# Patient Record
Sex: Female | Born: 1970
Health system: Southern US, Community
[De-identification: ages and names within clinical notes are randomized; demographics above are authoritative.]

## PROBLEM LIST (undated history)

## (undated) DIAGNOSIS — T4145XA Adverse effect of unspecified anesthetic, initial encounter: Secondary | ICD-10-CM

## (undated) DIAGNOSIS — T8859XA Other complications of anesthesia, initial encounter: Secondary | ICD-10-CM

## (undated) DIAGNOSIS — C801 Malignant (primary) neoplasm, unspecified: Secondary | ICD-10-CM

## (undated) HISTORY — PX: WISDOM TOOTH EXTRACTION: SHX21

## (undated) HISTORY — DX: Malignant (primary) neoplasm, unspecified: C80.1

## (undated) HISTORY — PX: COLONOSCOPY: SHX174

## (undated) HISTORY — PX: BREAST SURGERY: SHX581

## (undated) HISTORY — PX: TONSILLECTOMY: SUR1361

---

## 2005-04-22 ENCOUNTER — Ambulatory Visit (HOSPITAL_BASED_OUTPATIENT_CLINIC_OR_DEPARTMENT_OTHER): Admission: RE | Admit: 2005-04-22 | Discharge: 2005-04-22 | Payer: Self-pay | Admitting: Plastic Surgery

## 2005-04-22 ENCOUNTER — Encounter (INDEPENDENT_AMBULATORY_CARE_PROVIDER_SITE_OTHER): Payer: Self-pay | Admitting: Specialist

## 2006-10-10 ENCOUNTER — Encounter: Admission: RE | Admit: 2006-10-10 | Discharge: 2006-10-10 | Payer: Self-pay | Admitting: Gastroenterology

## 2008-03-20 ENCOUNTER — Ambulatory Visit: Payer: Self-pay | Admitting: Vascular Surgery

## 2008-07-26 ENCOUNTER — Ambulatory Visit: Payer: Self-pay | Admitting: Vascular Surgery

## 2008-09-04 ENCOUNTER — Ambulatory Visit: Payer: Self-pay | Admitting: Vascular Surgery

## 2008-09-18 ENCOUNTER — Ambulatory Visit: Payer: Self-pay | Admitting: Vascular Surgery

## 2010-11-10 NOTE — Assessment & Plan Note (Signed)
OFFICE VISIT   Gombert, Kelin  DOB:  15-Oct-1970                                       07/26/2008  EAVWU#:[id number]   The patient presents today for continued follow-up of her bilateral  venous pathology.  The patient is a very pleasant 38-year white female  with bilateral venous pathology.  She has marked reticular varicosities  over her right anterior thigh extending to her right lateral knee.  She  had undergone formal duplex 3 months ago and this showed no evidence of  reflux in her saphenofemoral system on the right.  On the left leg she  has tributary varicose veins in her medial thigh extending down onto her  calf.  Duplex on this side also showed no evidence of saphenous vein  reflux.  She has worn thigh-high graduated compression stockings since  our initial evaluation with her and she reports this does not give her  any relief whatsoever.  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  She has difficulty doing her usual  household activities due to leg pain as well.  Her physical exam is  unchanged.  She has palpable dorsalis pedis pulses bilaterally.  She  does have marked reticular veins over her right anterior thigh and  tributary varicose veins through her left medial thigh and calf.  I  again imaged her left saphenous vein with handheld duplex.  These  varicosities do arise off the saphenous vein in the calf, but again I  cannot demonstrate any evidence of reflux.  I discussed the options with  Ms. Cerrato.  I explained that her treatment option would be sclerotherapy  on the reticular veins on the right leg and would recommend stab  phlebectomy of the varicose veins on her left leg.  I explained that  since she is forming these at an early age that she is at significant  risk for continued deformities over time.  I explained that we would  attempt to  treat as many as possible but that she would have, obviously,  some residual reticular veins due to the diffuse nature of these.  Hopefully, we can improve her symptoms with sclerotherapy on the right  and phlebectomy on the left.  We will schedule this at her convenience.   Larina Earthly, M.D.  Electronically Signed   TFE/MEDQ  D:  07/26/2008  T:  07/29/2008  Job:  [id number]   cc:   Chales Salmon. Abigail Miyamoto, M.D.

## 2010-11-10 NOTE — Procedures (Signed)
LOWER EXTREMITY VENOUS REFLUX EXAM   INDICATION:  Bilateral lower extremity painful varicose veins.   EXAM:  Using color-flow imaging and pulse Doppler spectral analysis, the  right and left common femoral, superficial femoral, popliteal, posterior  tibial greater and lesser saphenous veins are evaluated.  There is  evidence suggesting deep venous insufficiency in the right and left  lower extremity.   The right and left saphenofemoral junctions are competent.  The right  and left GSV's are competent.   The right and left proximal short saphenous veins demonstrate  competency.   GSV Diameter (used if found to be incompetent only)                                            Right    Left  Proximal Greater Saphenous Vein           cm       cm  Proximal-to-mid-thigh                     cm       cm  Mid thigh                                 cm       cm  Mid-distal thigh                          cm       cm  Distal thigh                              cm       cm  Knee                                      cm       cm   IMPRESSION:  1. The right and left greater saphenous veins show no evidence of      significant reflux.  2. The right and left greater saphenous veins are not aneurysmal.  3. The right and left greater saphenous veins are not tortuous.  4. The deep venous system is mildly incompetent.  5. The right and left lesser saphenous veins are competent.   ___________________________________________  Larina Earthly, M.D.   AS/MEDQ  D:  03/20/2008  T:  03/20/2008  Job:  [phone]

## 2010-11-10 NOTE — Assessment & Plan Note (Signed)
OFFICE VISIT   [name], Carol  DOB:  Dec 03, 1970                                       09/18/2008  OVFIE#:[id number]   The patient presents today for followup after her treatment for venous  pathology on 09/04/2008.  She had treatment on her left leg with  multiple stabs in her thigh and calf.  She had sclerotherapy on her  right leg to some reticular varicosities.  She looked quite good today  with minimal discomfort.  There is one area on her anterior right thigh  of sclerotherapy where she had a very prominent blue vein in the skin.  This has thrombosed as expected with the sclerotherapy.  I did discuss  this with the patient and recommended a small stab with an 11 blade over  this to express any blood under the skin.  This was done under sterile  condition.  She did have some old blood expressed from this area.  She  is quite pleased with her initial result as am I.  We will see her again  on an as needed basis.   Larina Earthly, M.D.  Electronically Signed   TFE/MEDQ  D:  09/18/2008  T:  09/19/2008  Job:  [id number]   cc:   Chales Salmon. Abigail Miyamoto, M.D.

## 2010-11-10 NOTE — Consult Note (Signed)
NEW PATIENT CONSULTATION   Carol [name], Carol [name]  DOB:  02-Jul-1970                                       03/20/2008  [id number]#:[id number]   The patient presents today for evaluation of bilateral lower extremity  venous pathology.  She is a healthy 40 year old white female with  progressively severe reticular varicosities in both lower extremities.  These initially presented 14 years ago in her medial left thigh at the  time of pregnancy.  She has had progression of these and also has  reticular varicosity that begins on her right anterior thigh and extends  laterally to her lateral thigh, lateral knee, and right calf.  She does  not have any history of deep vein thrombosis or bleeding.  She reports  an aching, itchy, painful sensation over the varicosities bilaterally.   PAST HISTORY:  Significant only for sinusitis, for which she takes  Allegra and Flonase as needed.   ALLERGIES:  Sulfa and Keflex.   She is a nonsmoker.  She does have a question of restless leg syndrome.  She does not drink alcohol on a regular basis.   REVIEW OF SYSTEMS:  Her weight is reported 105 pounds, she is 5 feet 1  inches tall.  She has no cardiac, pulmonary, GI, or GU difficulties.   PHYSICAL EXAM:  She is a thin white female in no acute distress.  She  does have reticular varicosities that are bulging in her medial left  thigh and 1 area of reticular varicosities that are prominent and  bulging in her right anterior thigh.  She has subcutaneous reticular  varicosities extending down her right lateral thigh.  She does not have  any significant swelling.   She underwent noninvasive vascular studies in our office and this  reveals no evidence of significant reflux in her deep or superficial  system.  She does have reflux in the tributary varicosities in her left  medial thigh.  I discussed the significance with the patient.  I  explained that she is not at any increased risk for significant  venous  pathology, such as deep vein thrombosis, related to this.  We have  considered her for thigh-high graduated compression stockings today to  determine if this will improve her symptoms.  She is instructed on the  daily use of these.  I have also explained that these reticular  varicosities are of a size that could be treated with sclerotherapy in  our office.  We will initiate the conservative treatment with elevation,  ibuprofen, and graduated compression garments.  Plan to see her again in  3 months for a further discussion.   Larina Earthly, M.D.  Electronically Signed   TFE/MEDQ  D:  03/20/2008  T:  03/21/2008  Job:  [id number]   cc:   Chales Salmon. Abigail Miyamoto, M.D.

## 2010-11-10 NOTE — Assessment & Plan Note (Signed)
OFFICE VISIT   [name], Carol [name]  DOB:  Jul 24, 1970                                       09/04/2008  [id number]#:[id number]   The patient presents today for stab phlebectomy of painful reticular  varicosities bilaterally.  These were in her right thigh and calf.  She  also underwent sclerotherapy to both legs and her left proximal groin  and right lateral calf for painful reticular veins.  This was with 0.3%  sodium tetradecyl mixed 50% with CO2.  She had no immediate  complications and will see me again in 1 month for continued followup.   Larina Earthly, M.D.  Electronically Signed   TFE/MEDQ  D:  09/04/2008  T:  09/05/2008  Job:  [id number]

## 2010-11-13 NOTE — Op Note (Signed)
NAMEKRISTOL, Carol [name]                   ACCOUNT NO.:  [id number]   MEDICAL RECORD NO.:  [id number]          PATIENT TYPE:  AMB   LOCATION:  DSC                          FACILITY:  [facility]   PHYSICIAN:  Alfredia Ferguson, M.D.  DATE OF BIRTH:  Apr 08, 1971   DATE OF PROCEDURE:  04/22/2005  DATE OF DISCHARGE:                                 OPERATIVE REPORT   PREOPERATIVE DIAGNOSES:  1.  Biopsy-proven dysplastic nevus, right upper paraspinous area, 9-mm      biopsy site.  2.  Biopsy-proven dysplastic nevus, right mid-abdomen, 8-mm biopsy site.   POSTOPERATIVE DIAGNOSES:  1.  Biopsy-proven dysplastic nevus, right upper paraspinous area, 9-mm      biopsy site.  2.  Biopsy-proven dysplastic nevus, right mid-abdomen, 8-mm biopsy site.   OPERATION PERFORMED:  1.  Excision of biopsy site, right upper back paraspinal area.  2.  Excision of biopsy site, right mid-abdomen.   SURGEON:  Alfredia Ferguson, M.D.   ANESTHESIA:  2% Xylocaine  and 1:100,000 epinephrine.   INDICATION FOR SURGERY:  This is a 40 year old woman with biopsy-proven  dysplastic nevi x2.  She wishes to undergo excision of the lesions in order  to clear the margins.  The patient understands the risk of positive margins,  unsightly scarring, dehiscence of the wound and overall dissatisfaction with  the results.  In spite of these risks, the patient wished to proceed with  the surgery.   DESCRIPTION OF OPERATION:  The patient was placed in a prone position first  with skin marks placed around the biopsy site in a transversely oriented  direction with approximately 2- to 3-mm margins.  Local anesthesia was  infiltrated.  The patient was then rolled into the supine position and skin  marks were placed around the abdominal biopsy site with approximately 2- to  3-mm margins.  Again, local anesthesia was infiltrated.  The abdominal  biopsy site was now prepped with Betadine and draped with sterile drapes.  After waiting 5 minutes, an  elliptical excision of the site down to the  level of the subcutaneous tissue was carried out.  Specimen was submitted  for pathology.  Hemostasis was accomplished using pressure.  The wound edges  were undermined for a distance of several millimeters in all directions.  The wound was closed by approximating the dermis with interrupted 4-0  Monocryl suture.  The skin edges were united with interrupted nylon sutures.  The area was cleansed and dried and a light  dressings was applied.  The patient was then rolled back into a prone  position, where an identical procedure was performed on the lesion in her  upper right back.  Closure was carried out in a similar fashion.  There area  was cleansed and dried and light dressings were applied.  The patient was  discharged home in satisfactory condition.      Alfredia Ferguson, M.D.  Electronically Signed     WBB/MEDQ  D:  04/22/2005  T:  04/22/2005  Job:  [id number]   cc:   Elmon Else MD

## 2012-01-25 DIAGNOSIS — L821 Other seborrheic keratosis: Secondary | ICD-10-CM | POA: Diagnosis present

## 2014-02-11 ENCOUNTER — Encounter (INDEPENDENT_AMBULATORY_CARE_PROVIDER_SITE_OTHER): Payer: Self-pay | Admitting: Surgery

## 2014-02-11 ENCOUNTER — Ambulatory Visit (INDEPENDENT_AMBULATORY_CARE_PROVIDER_SITE_OTHER): Payer: BC Managed Care – PPO | Admitting: Surgery

## 2014-02-11 VITALS — BP 98/60 | HR 64 | Temp 98.6°F | Resp 14 | Ht 61.0 in | Wt 104.6 lb

## 2014-02-11 DIAGNOSIS — K645 Perianal venous thrombosis: Secondary | ICD-10-CM

## 2014-02-11 NOTE — Progress Notes (Signed)
[place], MD,  [address]  [place], [place]    [place] Phone:  [phone] FAX:  [phone]   Re:   Carol [name] DOB:   [birth year] MRN:   [id number]  Urgent Office  ASSESSMENT AND PLAN: 1.  Thrombosed hemorrhoid   I did a I&D of the thrombosed hemorrhoid.  I gave her a book on hemorrhoids  Return appt is PRN.  HISTORY OF PRESENT ILLNESS: Chief Complaint  Patient presents with  . Follow-up    possible throm hems   Carol [name] is a 43 y.o. (DOB: 07-23-1970)  white  female who is a patient of No primary provider on file. and comes to Urgent Office today for thrombosed hemorrhoid. She comes by herself. She had a thrombosed hemorrhoid lanced by Dr. Madison Hickman about 3 to 4 years ago.   Her birth mother died of colon cancer in her 8's, so she gets colonoscopies every 5 years.  Her last colonoscopy was by Dr. Wynetta Emery in 2011, so she if up for next year.  She has trouble with hemorrhoids, but only rarely has them thrombose. [place] was [place] the beach [place], [place]13, when this began.  I think that she said she was helping her daughter move into college.  She has used Prep H and Tucks without relief.  She has done sitz baths, but only one per day.  Past Medical History  Diagnosis Date  . 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  PHYSICAL EXAM: BP 98/60  Pulse 64  Temp(Src) 98.6 F (37 C) (Oral)  Resp 14  Ht 5\' 1"  (1.549 m)  Wt 104 lb 9.6 oz (47.446 kg)  BMI 19.77 kg/m2  Rectum:  1.5 cm thrombosed hemorrhoid at the right lateral anal verge with necrosis.  It was already starting to decompress.  No rectal mass.  Procedure:  I cleaned her anus with betadine.  I infiltrated 3 cc of 1% xylocaine.  I made an incision in the skin and extracted two clots about 1.5 cm each.   DATA REVIEWED: Epic notes  Alphonsa Overall, MD,  [facility],  [address]  [place], [place]    [place] Phone:  [phone] FAX:  [phone]

## 2015-03-13 ENCOUNTER — Other Ambulatory Visit: Payer: Self-pay | Admitting: Gastroenterology

## 2015-03-14 ENCOUNTER — Encounter (HOSPITAL_COMMUNITY): Payer: Self-pay | Admitting: *Deleted

## 2015-03-24 ENCOUNTER — Ambulatory Visit (HOSPITAL_COMMUNITY)
Admission: RE | Admit: 2015-03-24 | Discharge: 2015-03-24 | Disposition: A | Discharge status: Home / Self Care | Payer: BLUE CROSS/BLUE SHIELD | Source: Ambulatory Visit | Attending: Gastroenterology | Admitting: Gastroenterology

## 2015-03-24 ENCOUNTER — Ambulatory Visit (HOSPITAL_COMMUNITY): Payer: BLUE CROSS/BLUE SHIELD | Admitting: Anesthesiology

## 2015-03-24 ENCOUNTER — Encounter (HOSPITAL_COMMUNITY)
Admission: RE | Disposition: A | Discharge status: Home / Self Care | Payer: Self-pay | Source: Ambulatory Visit | Attending: Gastroenterology

## 2015-03-24 ENCOUNTER — Encounter (HOSPITAL_COMMUNITY): Payer: Self-pay

## 2015-03-24 DIAGNOSIS — Z85820 Personal history of malignant melanoma of skin: Secondary | ICD-10-CM | POA: Insufficient documentation

## 2015-03-24 DIAGNOSIS — Z86010 Personal history of colonic polyps: Secondary | ICD-10-CM | POA: Diagnosis not present

## 2015-03-24 DIAGNOSIS — Z85828 Personal history of other malignant neoplasm of skin: Secondary | ICD-10-CM | POA: Insufficient documentation

## 2015-03-24 DIAGNOSIS — D125 Benign neoplasm of sigmoid colon: Secondary | ICD-10-CM | POA: Insufficient documentation

## 2015-03-24 DIAGNOSIS — Z1211 Encounter for screening for malignant neoplasm of colon: Secondary | ICD-10-CM | POA: Diagnosis not present

## 2015-03-24 HISTORY — DX: Other complications of anesthesia, initial encounter: T88.59XA

## 2015-03-24 HISTORY — PX: COLONOSCOPY WITH PROPOFOL: SHX5780

## 2015-03-24 HISTORY — DX: Adverse effect of unspecified anesthetic, initial encounter: T41.45XA

## 2015-03-24 SURGERY — COLONOSCOPY WITH PROPOFOL
Anesthesia: Monitor Anesthesia Care

## 2015-03-24 MED ORDER — PROPOFOL 10 MG/ML IV BOLUS
INTRAVENOUS | Status: DC | PRN
  Administered 2015-03-24 (×2): 30 mg via INTRAVENOUS
  Administered 2015-03-24 (×7): 20 mg via INTRAVENOUS
  Administered 2015-03-24: 40 mg via INTRAVENOUS
  Administered 2015-03-24 (×2): 20 mg via INTRAVENOUS
  Administered 2015-03-24: 10 mg via INTRAVENOUS
  Administered 2015-03-24 (×2): 20 mg via INTRAVENOUS

## 2015-03-24 MED ORDER — PROPOFOL 10 MG/ML IV BOLUS
INTRAVENOUS | Status: AC
  Filled 2015-03-24: qty 20

## 2015-03-24 MED ORDER — SODIUM CHLORIDE 0.9 % IV SOLN: INTRAVENOUS | Status: DC

## 2015-03-24 MED ORDER — PROMETHAZINE HCL 25 MG/ML IJ SOLN: 6.2500 mg | INTRAMUSCULAR | Status: DC | PRN

## 2015-03-24 MED ORDER — LACTATED RINGERS IV SOLN
INTRAVENOUS | Status: DC
  Administered 2015-03-24: 11:00:00 via INTRAVENOUS

## 2015-03-24 SURGICAL SUPPLY — 22 items

## 2015-03-24 NOTE — Op Note (Signed)
Procedure: Surveillance colonoscopy. 11/11/2006 colonoscopy performed with removal of two 5 mm adenomatous transverse colon polyps. Mother died of colon cancer at age 45.  Endoscopist: Earle Gell  Premedication: Propofol administered by anesthesia  Procedure: The patient was placed in the left lateral decubitus position. Anal inspection and digital rectal exam were normal. The Pentax pediatric colonoscope was introduced into the rectum and advanced to the cecum. A normal-appearing appendiceal orifice was identified. A normal-appearing ileocecal valve was intubated and the terminal ileum inspected. Colonic preparation for the exam today was good. Withdrawal time was 10 minutes. The patient had 2 episodes of vomiting as she was consuming her morning dose of colonic lavage preparation.  Rectum. Normal. Retroflex view of the distal rectum was normal  Sigmoid colon. From the distal sigmoid colon, a 5 mm sessile polyp was removed with the cold snare  Descending colon. Normal  Splenic flexure. Normal  Transverse colon. Normal  Hepatic flexure. Normal  Ascending colon. Normal  Cecum and ileocecal valve. Normal  Terminal ileum. Normal  Assessment: A small polyp was removed from the distal sigmoid colon. Otherwise normal colonoscopy  Recommendation: Schedule surveillance colonoscopy in 5 years.

## 2015-03-24 NOTE — Transfer of Care (Signed)
Immediate Anesthesia Transfer of Care Note  Patient: Carol [name]  Procedure(s) Performed: Procedure(s): COLONOSCOPY WITH PROPOFOL (N/A)  Patient Location: PACU  Anesthesia Type:MAC  Level of Consciousness: sedated  Airway & Oxygen Therapy: Patient Spontanous Breathing and Patient connected to nasal cannula oxygen  Post-op Assessment: Report given to RN and Post -op Vital signs reviewed and stable  Post vital signs: Reviewed and stable  Last Vitals:  Filed Vitals:   03/24/15 1121  BP:   Pulse: 98  Temp: 36.5 C  Resp:     Complications: No apparent anesthesia complications

## 2015-03-24 NOTE — H&P (Signed)
  Procedure: Surveillance colonoscopy. 11/11/2006 colonoscopy performed with removal of two 5 mm adenomatous transverse colon polyps  History: The patient is a 44 year old female born 11/17/1970. She is scheduled to undergo a surveillance colonoscopy today.  Past medical history: Melanoma skin cancer. Squamous cell skin cancer. Basal cell skin cancer.  Medication allergies: Careful. Sulfa. Minocycline.  Exam: The patient is alert and lying comfortably on the endoscopy stretcher. Abdomen is soft and nontender to palpation. Lungs are clear to auscultation. Cardiac exam reveals a regular rhythm.  Plan: Proceed with surveillance colonoscopy

## 2015-03-24 NOTE — Discharge Instructions (Signed)
Colonoscopy, Care After °These instructions give you information on caring for yourself after your procedure. Your doctor may also give you more specific instructions. Call your doctor if you have any problems or questions after your procedure. °HOME CARE °· Do not drive for 24 hours. °· Do not sign important papers or use machinery for 24 hours. °· You may shower. °· You may go back to your usual activities, but go slower for the first 24 hours. °· Take rest breaks often during the first 24 hours. °· Walk around or use warm packs on your belly (abdomen) if you have belly cramping or gas. °· Drink enough fluids to keep your pee (urine) clear or pale yellow. °· Resume your normal diet. Avoid heavy or fried foods. °· Avoid drinking alcohol for 24 hours or as told by your doctor. °· Only take medicines as told by your doctor. °If a tissue sample (biopsy) was taken during the procedure:  °· Do not take aspirin or blood thinners for 7 days, or as told by your doctor. °· Do not drink alcohol for 7 days, or as told by your doctor. °· Eat soft foods for the first 24 hours. °GET HELP IF: °You still have a small amount of blood in your poop (stool) 2-3 days after the procedure. °GET HELP RIGHT AWAY IF: °· You have more than a small amount of blood in your poop. °· You see clumps of tissue (blood clots) in your poop. °· Your belly is puffy (swollen). °· You feel sick to your stomach (nauseous) or throw up (vomit). °· You have a fever. °· You have belly pain that gets worse and medicine does not help. °MAKE SURE YOU: °· Understand these instructions. °· Will watch your condition. °· Will get help right away if you are not doing well or get worse. °Document Released: 07/17/2010 Document Revised: 06/19/2013 Document Reviewed: 02/19/2013 °ExitCare® Patient Information ©2015 [employer]. This information is not intended to replace advice given to you by your health care provider. Make sure you discuss any questions you have with  your health care provider. ° °

## 2015-03-24 NOTE — Anesthesia Preprocedure Evaluation (Signed)
Anesthesia Evaluation  Patient identified by MRN, date of birth, ID band Patient awake    Reviewed: Allergy & Precautions, NPO status , Patient's Chart, lab work & pertinent test results  Airway Mallampati: II  TM Distance: >3 FB Neck ROM: Full    Dental no notable dental hx.    Pulmonary neg pulmonary ROS,    Pulmonary exam normal breath sounds clear to auscultation       Cardiovascular negative cardio ROS Normal cardiovascular exam Rhythm:Regular Rate:Normal     Neuro/Psych negative neurological ROS  negative psych ROS   GI/Hepatic negative GI ROS, Neg liver ROS,   Endo/Other  negative endocrine ROS  Renal/GU negative Renal ROS  negative genitourinary   Musculoskeletal negative musculoskeletal ROS (+)   Abdominal   Peds negative pediatric ROS (+)  Hematology negative hematology ROS (+)   Anesthesia Other Findings   Reproductive/Obstetrics negative OB ROS                             Anesthesia Physical Anesthesia Plan  ASA: I  Anesthesia Plan: MAC   Post-op Pain Management:    Induction: Intravenous  Airway Management Planned: Simple Face Mask  Additional Equipment:   Intra-op Plan:   Post-operative Plan:   Informed Consent: I have reviewed the patients History and Physical, chart, labs and discussed the procedure including the risks, benefits and alternatives for the proposed anesthesia with the patient or authorized representative who has indicated his/her understanding and acceptance.   Dental advisory given  Plan Discussed with: CRNA and Surgeon  Anesthesia Plan Comments:         Anesthesia Quick Evaluation

## 2015-03-24 NOTE — Anesthesia Postprocedure Evaluation (Signed)
  Anesthesia Post-op Note  Patient: Carol [name]  Procedure(s) Performed: Procedure(s) (LRB): COLONOSCOPY WITH PROPOFOL (N/A)  Patient Location: PACU  Anesthesia Type: MAC  Level of Consciousness: awake and alert   Airway and Oxygen Therapy: Patient Spontanous Breathing  Post-op Pain: mild  Post-op Assessment: Post-op Vital signs reviewed, Patient's Cardiovascular Status Stable, Respiratory Function Stable, Patent Airway and No signs of Nausea or vomiting  Last Vitals:  Filed Vitals:   03/24/15 1320  BP: 98/64  Pulse: 66  Temp:   Resp: 21    Post-op Vital Signs: stable   Complications: No apparent anesthesia complications

## 2015-03-25 ENCOUNTER — Encounter (HOSPITAL_COMMUNITY): Payer: Self-pay | Admitting: Gastroenterology

## 2015-09-15 DIAGNOSIS — L111 Transient acantholytic dermatosis [Grover]: Secondary | ICD-10-CM | POA: Diagnosis present

## 2017-05-12 ENCOUNTER — Other Ambulatory Visit: Payer: Self-pay | Admitting: Family Medicine

## 2017-05-12 DIAGNOSIS — R140 Abdominal distension (gaseous): Secondary | ICD-10-CM

## 2017-05-23 ENCOUNTER — Ambulatory Visit
Admission: RE | Admit: 2017-05-23 | Discharge: 2017-05-23 | Disposition: A | Discharge status: Home / Self Care | Payer: BLUE CROSS/BLUE SHIELD | Source: Ambulatory Visit | Attending: Family Medicine | Admitting: Family Medicine

## 2017-05-23 ENCOUNTER — Other Ambulatory Visit: Payer: BLUE CROSS/BLUE SHIELD

## 2017-05-23 DIAGNOSIS — R140 Abdominal distension (gaseous): Secondary | ICD-10-CM

## 2019-06-11 ENCOUNTER — Other Ambulatory Visit: Payer: Self-pay

## 2019-06-11 DIAGNOSIS — Z20822 Contact with and (suspected) exposure to covid-19: Secondary | ICD-10-CM

## 2019-06-14 ENCOUNTER — Telehealth: Payer: Self-pay

## 2019-06-14 ENCOUNTER — Telehealth: Payer: Self-pay | Admitting: *Deleted

## 2019-06-14 LAB — NOVEL CORONAVIRUS, NAA: SARS-CoV-2, NAA: DETECTED — AB

## 2019-06-14 NOTE — Telephone Encounter (Signed)
Pt given result; tier of symptoms reviewed; instructions given to pt: . remain in self-quarantine until they meet the "Non-Test Criteria for Ending Self-Isolation". Non-Test Criteria for Ending Self-Isolation All persons with fever and respiratory symptoms should isolate themselves until ALL conditions listed below are met: - at least 10 days since symptoms onset - AND 3 consecutive days fever free without antipyretics (acetaminophen [Tylenol] or ibuprofen [Advil]) - AND improvement in respiratory symptoms . If the patient develops respiratory issues/distress, seek medical care in the Emergency Department, call 911, reports symptoms and report COVID-19 positive test. Patient Instructions . continue to utilize over-the-counter medications for fever (ibuprofen and/or Tylenol) and cough (cough medicine and/or sore throat lozenges). . wear a mask around people and follow good infection prevention techniques. . Patient to should only leave home to seek medical care. . send family for food, prescriptions or medicines; or use delivery service.  . If the patient must leave the home, they must wear a mask in public. . Instruct patient to limit contact with immediate family members or caregivers in the home, and use mask, social distancing, and handwashing to decrease risk to patients. o Please continue good preventive care measures, including frequent hand washing, avoid touching your face, cover coughs/sneezes with tissue or into elbow, stay out of crowds and keep a 6-foot distance from others.   patient and family to clean hard surfaces touched by patient frequently with household cleaning products.  Pt advised to notify his PCP; she was also informed the [place] was notified; she verbalized understand

## 2019-06-14 NOTE — Telephone Encounter (Signed)
Pt called to get test Covid test results. Informed her that a nurse would be reaching out to her soon. She voiced understanding and denied any questions.   [place]

## 2019-06-14 NOTE — Telephone Encounter (Signed)
Left a vm for patient to callback regarding questions

## 2020-03-25 ENCOUNTER — Other Ambulatory Visit (HOSPITAL_COMMUNITY): Payer: Self-pay | Admitting: Family Medicine

## 2020-03-25 DIAGNOSIS — R0989 Other specified symptoms and signs involving the circulatory and respiratory systems: Secondary | ICD-10-CM

## 2020-03-25 DIAGNOSIS — R011 Cardiac murmur, unspecified: Secondary | ICD-10-CM

## 2020-04-10 ENCOUNTER — Ambulatory Visit (HOSPITAL_COMMUNITY)
Admission: RE | Admit: 2020-04-10 | Discharge: 2020-04-10 | Disposition: A | Discharge status: Home / Self Care | Payer: PRIVATE HEALTH INSURANCE | Source: Ambulatory Visit | Attending: Family Medicine | Admitting: Family Medicine

## 2020-04-10 ENCOUNTER — Ambulatory Visit (HOSPITAL_BASED_OUTPATIENT_CLINIC_OR_DEPARTMENT_OTHER)
Admission: RE | Admit: 2020-04-10 | Discharge: 2020-04-10 | Disposition: A | Discharge status: Home / Self Care | Payer: PRIVATE HEALTH INSURANCE | Source: Ambulatory Visit

## 2020-04-10 ENCOUNTER — Other Ambulatory Visit: Payer: Self-pay

## 2020-04-10 DIAGNOSIS — R011 Cardiac murmur, unspecified: Secondary | ICD-10-CM

## 2020-04-10 DIAGNOSIS — R0989 Other specified symptoms and signs involving the circulatory and respiratory systems: Secondary | ICD-10-CM

## 2020-04-10 LAB — ECHOCARDIOGRAM COMPLETE
Area-P 1/2: 4.8 cm2
S' Lateral: 1.9 cm

## 2020-04-10 NOTE — Progress Notes (Signed)
  Echocardiogram 2D Echocardiogram has been performed.  Carol [name] 04/10/2020, 3:29 PM

## 2020-04-10 NOTE — Progress Notes (Signed)
VASCULAR LAB    Carotid duplex has been performed.  See CV proc for preliminary results.   Devanny Palecek, RVT 04/10/2020, 3:59 PM

## 2020-06-18 DIAGNOSIS — F419 Anxiety disorder, unspecified: Secondary | ICD-10-CM | POA: Diagnosis present

## 2020-06-18 DIAGNOSIS — K219 Gastro-esophageal reflux disease without esophagitis: Secondary | ICD-10-CM | POA: Diagnosis present

## 2020-06-18 DIAGNOSIS — E7800 Pure hypercholesterolemia, unspecified: Secondary | ICD-10-CM | POA: Diagnosis present

## 2020-06-18 DIAGNOSIS — G2581 Restless legs syndrome: Secondary | ICD-10-CM | POA: Diagnosis present

## 2020-07-07 ENCOUNTER — Other Ambulatory Visit: Payer: Self-pay | Admitting: Radiology

## 2021-01-13 ENCOUNTER — Ambulatory Visit (INDEPENDENT_AMBULATORY_CARE_PROVIDER_SITE_OTHER): Payer: BC Managed Care – PPO | Admitting: Dermatology

## 2021-01-13 ENCOUNTER — Other Ambulatory Visit: Payer: Self-pay

## 2021-01-13 DIAGNOSIS — Z1283 Encounter for screening for malignant neoplasm of skin: Secondary | ICD-10-CM | POA: Diagnosis not present

## 2021-01-13 DIAGNOSIS — Z85828 Personal history of other malignant neoplasm of skin: Secondary | ICD-10-CM | POA: Diagnosis not present

## 2021-01-13 DIAGNOSIS — L821 Other seborrheic keratosis: Secondary | ICD-10-CM

## 2021-01-13 DIAGNOSIS — L111 Transient acantholytic dermatosis [Grover]: Secondary | ICD-10-CM

## 2021-01-13 DIAGNOSIS — D1801 Hemangioma of skin and subcutaneous tissue: Secondary | ICD-10-CM | POA: Diagnosis not present

## 2021-01-13 MED ORDER — CLOBETASOL PROP EMOLLIENT BASE 0.05 % EX CREA
TOPICAL_CREAM | CUTANEOUS | 1 refills | Status: DC
Start: 2021-01-13 — End: 2021-02-11

## 2021-01-23 ENCOUNTER — Encounter: Payer: Self-pay | Admitting: Dermatology

## 2021-01-23 NOTE — Progress Notes (Signed)
   New Patient   Subjective  Carol [name] is a 50 y.o. female who presents for the following: Skin Problem (Patient is here for a second opinion on rash area on chest and back area. It does itch Possible Grover's and possible yeast. She doesn't think its part of grover's but she isn't sure./No treatment for this. She used the ketoconazole '200mg'$  for the grover's in the past. ).  Annual skin check, history of possible Grovers disease Location:  Duration:  Quality:  Associated Signs/Symptoms: Modifying Factors:  Severity:  Timing: Context:    The following portions of the chart were reviewed this encounter and updated as appropriate:  Tobacco  Allergies  Meds  Problems  Med Hx  Surg Hx  Fam Hx      Objective  Well appearing patient in no apparent distress; mood and affect are within normal limits. Torso - Posterior (Back) Full body skin check: No atypical pigmented lesions or normal skin cancer.  Mid Back 2 larger (5+mm) ones on the back, right sideburn: Brown textured flattopped papules.  Left Upper Back, Neck - Anterior Back and upper chest: Multiple 1 to 2 mm smooth red dermal papules  Left Preauricular Area Per patient previous bcc: No sign recurrence  Abdomen (Lower Torso, Anterior) Patient stated it itches and burns first flare 2012 abdomen and upper back itches but no bumps left neck she stated she can feel the bumps.  Clinically these two millimeter not quite symmetric pink inflammatory papules are compatible with but certainly not specific for Grovers disease.  We did discuss obtaining confirmatory biopsy but this will be deferred.    A full examination was performed including scalp, head, eyes, ears, nose, lips, neck, chest, axillae, abdomen, back, buttocks, bilateral upper extremities, bilateral lower extremities, hands, feet, fingers, toes, fingernails, and toenails. All findings within normal limits unless otherwise noted below.  Areas beneath undergarments not  fully examined.   Assessment & Plan  Screening exam for skin cancer Torso - Posterior (Back)  Encouraged to self examine twice annually.  Continue ultraviolet protection.  Seborrheic keratosis Mid Back  Leave if stable  Cherry angioma (2) Neck - Anterior; Left Upper Back  No intervention currently necessary  History of basal cell carcinoma (BCC) Left Preauricular Area  Recheck as needed change  Grover's disease Abdomen (Lower Torso, Anterior)  Clobetasol applied to affected areas daily after bathing for up to 4 week MyChart or phone in 6 weeks.  S; then cut back to episodic use if there is improvement.  Avoid use on face and body folds.  Follow-up by MyChart or phone in 6 weeks  Clobetasol Prop Emollient Base (CLOBETASOL PROPIONATE E) 0.05 % emollient cream - Abdomen (Lower Torso, Anterior) Apply to skin qhs when you flare

## 2021-02-11 ENCOUNTER — Encounter: Payer: Self-pay | Admitting: Podiatry

## 2021-02-11 ENCOUNTER — Other Ambulatory Visit: Payer: Self-pay

## 2021-02-11 ENCOUNTER — Ambulatory Visit (INDEPENDENT_AMBULATORY_CARE_PROVIDER_SITE_OTHER): Payer: BC Managed Care – PPO | Admitting: Podiatry

## 2021-02-11 DIAGNOSIS — B351 Tinea unguium: Secondary | ICD-10-CM

## 2021-02-11 MED ORDER — TERBINAFINE HCL 250 MG PO TABS: 250.0000 mg | ORAL_TABLET | Freq: Every day | ORAL | 0 refills | Status: AC

## 2021-02-11 NOTE — Progress Notes (Signed)
Subjective:   Patient ID: Carol [name], female   DOB: 50 y.o.   MRN: [id number]   HPI Patient has nail disease hallux right that has yellow discoloration distal two thirds and several other nails with discoloration on the right foot.  She does try to wear polish most the time so she is not sure what may have occurred and they are brittle and she does not smoke likes to be active   Review of Systems  All other systems reviewed and are negative.      Objective:  Physical Exam Vitals and nursing note reviewed.  Constitutional:      Appearance: She is well-developed.  Pulmonary:     Effort: Pulmonary effort is normal.  Musculoskeletal:        General: Normal range of motion.  Skin:    General: Skin is warm.  Neurological:     Mental Status: She is alert.    Neurovascular status intact muscle strength adequate range of motion within normal limits with patient noted to have discoloration of the hallux nail right that appears to be occlusion from using polish along with probable fungal infection possible trauma and several other nails that have partial discoloration     Assessment:  Mycotic nail infection that is all present along with trauma     Plan:  H&P education rendered concerning this type of a condition at this point organ to try her on a 45-day course of oral along with laser treatment and not using polishes frequently.  We will initiate this I explained risk factors and patient will start this treatment protocol

## 2021-02-23 ENCOUNTER — Other Ambulatory Visit: Payer: Self-pay

## 2021-03-03 ENCOUNTER — Telehealth: Payer: Self-pay | Admitting: Podiatry

## 2021-03-03 NOTE — Telephone Encounter (Signed)
Patient stated that this was her 2nd time calling about this issue

## 2021-03-03 NOTE — Telephone Encounter (Signed)
Patient requesting a call from the provider she states she had issues about being seen for her laser treatment/payment

## 2021-03-03 NOTE — Telephone Encounter (Signed)
Mohammed Kindle,  Can you please print this and give to Dr. Paulla Dolly to give this patient a call tomorrow, please.  Thanks,   Sindy Messing RN, BSN

## 2021-03-04 NOTE — Telephone Encounter (Signed)
Not sure what her problem is

## 2021-03-05 NOTE — Telephone Encounter (Signed)
Dr. Paulla Dolly,  Were you able to give her a call?

## 2021-03-11 ENCOUNTER — Other Ambulatory Visit: Payer: Self-pay

## 2021-03-11 ENCOUNTER — Ambulatory Visit (INDEPENDENT_AMBULATORY_CARE_PROVIDER_SITE_OTHER): Payer: Self-pay

## 2021-03-11 DIAGNOSIS — B351 Tinea unguium: Secondary | ICD-10-CM

## 2021-03-11 NOTE — Patient Instructions (Signed)

## 2021-03-11 NOTE — Progress Notes (Signed)
Patient presents today for the 1st laser treatment. Diagnosed with mycotic nail infection by Dr. Paulla Dolly.   Toenail most affected right hallux.  All other systems are negative.  Nails were filed thin. Laser therapy was administered to right 1-5 toenails  and patient tolerated the treatment well. All safety precautions were in place.    Follow up in 6 weeks for laser # 2.  Dr. Paulla Dolly advised patient that they would need 1-2 laser treatments along with the oral terbinafine medication.

## 2021-03-18 NOTE — Telephone Encounter (Signed)
Yes, just have her treatment 10/28 be free. Better to build good customer care

## 2021-03-28 ENCOUNTER — Other Ambulatory Visit: Payer: Self-pay | Admitting: Podiatry

## 2021-03-31 DIAGNOSIS — F419 Anxiety disorder, unspecified: Secondary | ICD-10-CM | POA: Diagnosis not present

## 2021-03-31 DIAGNOSIS — Z23 Encounter for immunization: Secondary | ICD-10-CM | POA: Diagnosis not present

## 2021-03-31 DIAGNOSIS — Z0000 Encounter for general adult medical examination without abnormal findings: Secondary | ICD-10-CM | POA: Diagnosis not present

## 2021-03-31 DIAGNOSIS — G2581 Restless legs syndrome: Secondary | ICD-10-CM | POA: Diagnosis not present

## 2021-03-31 DIAGNOSIS — E7800 Pure hypercholesterolemia, unspecified: Secondary | ICD-10-CM | POA: Diagnosis not present

## 2021-03-31 DIAGNOSIS — K219 Gastro-esophageal reflux disease without esophagitis: Secondary | ICD-10-CM | POA: Diagnosis not present

## 2021-04-01 DIAGNOSIS — K645 Perianal venous thrombosis: Secondary | ICD-10-CM | POA: Diagnosis not present

## 2021-04-02 ENCOUNTER — Emergency Department (HOSPITAL_COMMUNITY): Payer: BC Managed Care – PPO

## 2021-04-02 ENCOUNTER — Encounter (HOSPITAL_COMMUNITY): Payer: Self-pay | Admitting: Emergency Medicine

## 2021-04-02 ENCOUNTER — Inpatient Hospital Stay (HOSPITAL_COMMUNITY)
Admission: EM | Admit: 2021-04-02 | Discharge: 2021-04-08 | DRG: 493 | Disposition: A | Discharge status: Home Health | Payer: BC Managed Care – PPO | Attending: Family Medicine | Admitting: Family Medicine

## 2021-04-02 DIAGNOSIS — F419 Anxiety disorder, unspecified: Secondary | ICD-10-CM

## 2021-04-02 DIAGNOSIS — I10 Essential (primary) hypertension: Secondary | ICD-10-CM | POA: Diagnosis present

## 2021-04-02 DIAGNOSIS — S82392A Other fracture of lower end of left tibia, initial encounter for closed fracture: Principal | ICD-10-CM | POA: Diagnosis present

## 2021-04-02 DIAGNOSIS — Z7982 Long term (current) use of aspirin: Secondary | ICD-10-CM

## 2021-04-02 DIAGNOSIS — Z79899 Other long term (current) drug therapy: Secondary | ICD-10-CM | POA: Diagnosis not present

## 2021-04-02 DIAGNOSIS — K219 Gastro-esophageal reflux disease without esophagitis: Secondary | ICD-10-CM | POA: Diagnosis not present

## 2021-04-02 DIAGNOSIS — L821 Other seborrheic keratosis: Secondary | ICD-10-CM | POA: Diagnosis not present

## 2021-04-02 DIAGNOSIS — L111 Transient acantholytic dermatosis [Grover]: Secondary | ICD-10-CM | POA: Diagnosis not present

## 2021-04-02 DIAGNOSIS — Z85820 Personal history of malignant melanoma of skin: Secondary | ICD-10-CM | POA: Diagnosis not present

## 2021-04-02 DIAGNOSIS — E7800 Pure hypercholesterolemia, unspecified: Secondary | ICD-10-CM

## 2021-04-02 DIAGNOSIS — Z419 Encounter for procedure for purposes other than remedying health state, unspecified: Secondary | ICD-10-CM

## 2021-04-02 DIAGNOSIS — S82252A Displaced comminuted fracture of shaft of left tibia, initial encounter for closed fracture: Secondary | ICD-10-CM | POA: Diagnosis not present

## 2021-04-02 DIAGNOSIS — K5903 Drug induced constipation: Secondary | ICD-10-CM | POA: Diagnosis not present

## 2021-04-02 DIAGNOSIS — S82832A Other fracture of upper and lower end of left fibula, initial encounter for closed fracture: Secondary | ICD-10-CM | POA: Diagnosis present

## 2021-04-02 DIAGNOSIS — F32A Depression, unspecified: Secondary | ICD-10-CM | POA: Diagnosis not present

## 2021-04-02 DIAGNOSIS — S8262XA Displaced fracture of lateral malleolus of left fibula, initial encounter for closed fracture: Secondary | ICD-10-CM | POA: Diagnosis not present

## 2021-04-02 DIAGNOSIS — T402X5A Adverse effect of other opioids, initial encounter: Secondary | ICD-10-CM | POA: Diagnosis not present

## 2021-04-02 DIAGNOSIS — S82302A Unspecified fracture of lower end of left tibia, initial encounter for closed fracture: Secondary | ICD-10-CM | POA: Diagnosis not present

## 2021-04-02 DIAGNOSIS — W098XXA Fall on or from other playground equipment, initial encounter: Secondary | ICD-10-CM | POA: Diagnosis present

## 2021-04-02 DIAGNOSIS — M62838 Other muscle spasm: Secondary | ICD-10-CM | POA: Diagnosis not present

## 2021-04-02 DIAGNOSIS — S82892A Other fracture of left lower leg, initial encounter for closed fracture: Secondary | ICD-10-CM | POA: Diagnosis not present

## 2021-04-02 DIAGNOSIS — S82202A Unspecified fracture of shaft of left tibia, initial encounter for closed fracture: Secondary | ICD-10-CM | POA: Diagnosis not present

## 2021-04-02 DIAGNOSIS — Z882 Allergy status to sulfonamides status: Secondary | ICD-10-CM

## 2021-04-02 DIAGNOSIS — Z9882 Breast implant status: Secondary | ICD-10-CM | POA: Diagnosis not present

## 2021-04-02 DIAGNOSIS — Z20822 Contact with and (suspected) exposure to covid-19: Secondary | ICD-10-CM | POA: Diagnosis present

## 2021-04-02 DIAGNOSIS — Z888 Allergy status to other drugs, medicaments and biological substances status: Secondary | ICD-10-CM

## 2021-04-02 DIAGNOSIS — M25572 Pain in left ankle and joints of left foot: Secondary | ICD-10-CM | POA: Diagnosis not present

## 2021-04-02 DIAGNOSIS — S82422A Displaced transverse fracture of shaft of left fibula, initial encounter for closed fracture: Secondary | ICD-10-CM | POA: Diagnosis not present

## 2021-04-02 DIAGNOSIS — R21 Rash and other nonspecific skin eruption: Secondary | ICD-10-CM | POA: Diagnosis not present

## 2021-04-02 DIAGNOSIS — S82842A Displaced bimalleolar fracture of left lower leg, initial encounter for closed fracture: Secondary | ICD-10-CM | POA: Diagnosis not present

## 2021-04-02 DIAGNOSIS — D62 Acute posthemorrhagic anemia: Secondary | ICD-10-CM | POA: Diagnosis not present

## 2021-04-02 DIAGNOSIS — Z881 Allergy status to other antibiotic agents status: Secondary | ICD-10-CM

## 2021-04-02 DIAGNOSIS — G2581 Restless legs syndrome: Secondary | ICD-10-CM

## 2021-04-02 LAB — CBC
HCT: 33.7 % — ABNORMAL LOW (ref 36.0–46.0)
Hemoglobin: 11.2 g/dL — ABNORMAL LOW (ref 12.0–15.0)
MCH: 30.4 pg (ref 26.0–34.0)
MCHC: 33.2 g/dL (ref 30.0–36.0)
MCV: 91.3 fL (ref 80.0–100.0)
Platelets: 161 10*3/uL (ref 150–400)
RBC: 3.69 MIL/uL — ABNORMAL LOW (ref 3.87–5.11)
RDW: 12.2 % (ref 11.5–15.5)
WBC: 8.1 10*3/uL (ref 4.0–10.5)
nRBC: 0 % (ref 0.0–0.2)

## 2021-04-02 LAB — CREATININE, SERUM
Creatinine, Ser: 0.91 mg/dL (ref 0.44–1.00)
GFR, Estimated: >60 mL/min (ref >60)

## 2021-04-02 LAB — BASIC METABOLIC PANEL
Anion gap: 7 (ref 5–15)
BUN: 25 mg/dL — ABNORMAL HIGH (ref 6–20)
CO2: 23 mmol/L (ref 22–32)
Calcium: 8.3 mg/dL — ABNORMAL LOW (ref 8.9–10.3)
Chloride: 109 mmol/L (ref 98–111)
Creatinine, Ser: 0.85 mg/dL (ref 0.44–1.00)
GFR, Estimated: >60 mL/min (ref >60)
Glucose, Bld: 105 mg/dL — ABNORMAL HIGH (ref 70–99)
Potassium: 3.9 mmol/L (ref 3.5–5.1)
Sodium: 139 mmol/L (ref 135–145)

## 2021-04-02 LAB — CBC WITH DIFFERENTIAL/PLATELET
Abs Immature Granulocytes: 0.02 10*3/uL (ref 0.00–0.07)
Basophils Absolute: 0 10*3/uL (ref 0.0–0.1)
Basophils Relative: 1 %
Eosinophils Absolute: 0 10*3/uL (ref 0.0–0.5)
Eosinophils Relative: 1 %
HCT: 33.2 % — ABNORMAL LOW (ref 36.0–46.0)
Hemoglobin: 11.1 g/dL — ABNORMAL LOW (ref 12.0–15.0)
Immature Granulocytes: 0 %
Lymphocytes Relative: 18 %
Lymphs Abs: 1.1 10*3/uL (ref 0.7–4.0)
MCH: 30.2 pg (ref 26.0–34.0)
MCHC: 33.4 g/dL (ref 30.0–36.0)
MCV: 90.5 fL (ref 80.0–100.0)
Monocytes Absolute: 0.4 10*3/uL (ref 0.1–1.0)
Monocytes Relative: 7 %
Neutro Abs: 4.6 10*3/uL (ref 1.7–7.7)
Neutrophils Relative %: 73 %
Platelets: 154 10*3/uL (ref 150–400)
RBC: 3.67 MIL/uL — ABNORMAL LOW (ref 3.87–5.11)
RDW: 12.2 % (ref 11.5–15.5)
WBC: 6.3 10*3/uL (ref 4.0–10.5)
nRBC: 0 % (ref 0.0–0.2)

## 2021-04-02 LAB — RESP PANEL BY RT-PCR (FLU A&B, COVID) ARPGX2
Influenza A by PCR: NEGATIVE
Influenza B by PCR: NEGATIVE
SARS Coronavirus 2 by RT PCR: NEGATIVE

## 2021-04-02 LAB — PROTIME-INR
INR: 1.2 (ref 0.8–1.2)
Prothrombin Time: 14.9 seconds (ref 11.4–15.2)

## 2021-04-02 MED ORDER — CITALOPRAM HYDROBROMIDE 10 MG PO TABS: 30.0000 mg | ORAL_TABLET | ORAL | Status: DC

## 2021-04-02 MED ORDER — ADULT MULTIVITAMIN W/MINERALS CH
1.0000 | ORAL_TABLET | Freq: Every day | ORAL | Status: DC
  Administered 2021-04-05 – 2021-04-08 (×4): 1 via ORAL
  Filled 2021-04-02 (×5): qty 1

## 2021-04-02 MED ORDER — HYDROMORPHONE HCL 1 MG/ML IJ SOLN
1.0000 mg | Freq: Once | INTRAMUSCULAR | Status: AC
  Administered 2021-04-02: 1 mg via INTRAVENOUS
  Filled 2021-04-02: qty 1

## 2021-04-02 MED ORDER — SODIUM CHLORIDE 0.9 % IV SOLN: Freq: Once | INTRAVENOUS | Status: AC

## 2021-04-02 MED ORDER — KETOROLAC TROMETHAMINE 30 MG/ML IJ SOLN: 30.0000 mg | Freq: Once | INTRAMUSCULAR | Status: DC

## 2021-04-02 MED ORDER — FENTANYL CITRATE PF 50 MCG/ML IJ SOSY
100.0000 ug | PREFILLED_SYRINGE | Freq: Once | INTRAMUSCULAR | Status: AC
  Administered 2021-04-02: 100 ug via INTRAVENOUS
  Filled 2021-04-02: qty 2

## 2021-04-02 MED ORDER — HAIR SKIN & NAILS ADVANCED PO TABS: ORAL_TABLET | Freq: Every day | ORAL | Status: DC

## 2021-04-02 MED ORDER — ONDANSETRON HCL 4 MG PO TABS: 4.0000 mg | ORAL_TABLET | Freq: Four times a day (QID) | ORAL | Status: DC | PRN

## 2021-04-02 MED ORDER — TERBINAFINE HCL 250 MG PO TABS
250.0000 mg | ORAL_TABLET | Freq: Every day | ORAL | Status: DC
  Filled 2021-04-02: qty 1

## 2021-04-02 MED ORDER — ONDANSETRON HCL 4 MG/2ML IJ SOLN
4.0000 mg | Freq: Four times a day (QID) | INTRAMUSCULAR | Status: DC | PRN
  Administered 2021-04-04 – 2021-04-05 (×3): 4 mg via INTRAVENOUS
  Filled 2021-04-02 (×3): qty 2

## 2021-04-02 MED ORDER — KETOROLAC TROMETHAMINE 30 MG/ML IJ SOLN
30.0000 mg | Freq: Four times a day (QID) | INTRAMUSCULAR | Status: AC | PRN
  Administered 2021-04-02 – 2021-04-05 (×9): 30 mg via INTRAVENOUS
  Filled 2021-04-02 (×9): qty 1

## 2021-04-02 MED ORDER — CITALOPRAM HYDROBROMIDE 20 MG PO TABS
20.0000 mg | ORAL_TABLET | ORAL | Status: DC
  Administered 2021-04-06 – 2021-04-08 (×2): 20 mg via ORAL
  Filled 2021-04-02 (×6): qty 1

## 2021-04-02 MED ORDER — PROPOFOL 10 MG/ML IV BOLUS
0.5000 mg/kg | Freq: Once | INTRAVENOUS | Status: AC
  Administered 2021-04-02: 110 mg via INTRAVENOUS
  Filled 2021-04-02: qty 20

## 2021-04-02 MED ORDER — HYDROMORPHONE HCL 1 MG/ML IJ SOLN
0.5000 mg | INTRAMUSCULAR | Status: DC | PRN
Start: 2021-04-02 — End: 2021-04-03
  Administered 2021-04-02 – 2021-04-03 (×7): 1 mg via INTRAVENOUS
  Filled 2021-04-02 (×8): qty 1

## 2021-04-02 MED ORDER — LACTATED RINGERS IV SOLN
INTRAVENOUS | Status: DC
Start: 2021-04-02 — End: 2021-04-05

## 2021-04-02 MED ORDER — METHOCARBAMOL 1000 MG/10ML IJ SOLN
500.0000 mg | Freq: Four times a day (QID) | INTRAVENOUS | Status: DC | PRN
  Administered 2021-04-02 – 2021-04-05 (×5): 500 mg via INTRAVENOUS
  Filled 2021-04-02: qty 5
  Filled 2021-04-02: qty 500
  Filled 2021-04-02: qty 5
  Filled 2021-04-02: qty 500
  Filled 2021-04-02: qty 5
  Filled 2021-04-02: qty 500
  Filled 2021-04-02: qty 5
  Filled 2021-04-02: qty 500

## 2021-04-02 MED ORDER — HYDROMORPHONE HCL 1 MG/ML IJ SOLN
1.0000 mg | Freq: Once | INTRAMUSCULAR | Status: AC
Start: 2021-04-02 — End: 2021-04-02
  Administered 2021-04-02: 1 mg via INTRAVENOUS
  Filled 2021-04-02: qty 1

## 2021-04-02 MED ORDER — ONDANSETRON HCL 4 MG/2ML IJ SOLN
4.0000 mg | Freq: Once | INTRAMUSCULAR | Status: AC
  Administered 2021-04-02: 4 mg via INTRAVENOUS
  Filled 2021-04-02 (×2): qty 2

## 2021-04-02 MED ORDER — ENOXAPARIN SODIUM 40 MG/0.4ML IJ SOSY
40.0000 mg | PREFILLED_SYRINGE | INTRAMUSCULAR | Status: DC
  Administered 2021-04-02: 40 mg via SUBCUTANEOUS
  Filled 2021-04-02: qty 0.4

## 2021-04-02 MED ORDER — FLUTICASONE PROPIONATE 50 MCG/ACT NA SUSP: 1.0000 | Freq: Every day | NASAL | Status: DC | PRN

## 2021-04-02 NOTE — ED Provider Notes (Signed)
Emergency Medicine Provider Triage Evaluation Note  Carol [name] , a 50 y.o. female  was evaluated in triage.  Pt complains of pain and swelling to the left distal leg.  Patient states she was playing on monkey bars, slipped, and fell and heard a pop in the region.  Reports immediate pain and swelling.  Obvious deformity.  Denies any numbness.  Physical Exam  Ht 5\' 1"  (1.549 m)   Wt 49.4 kg   BMI 20.60 kg/m  Gen:   Awake, no distress   Resp:  Normal effort  MSK:   Moves extremities without difficulty  Other:  Distal sensation intact in the left foot.  Wiggling toes without difficulty.  Palpable DP pulses.  Soft tissue swelling noted along the distal shin with obvious deformity.  Medical Decision Making  Medically screening exam initiated at 3:45 PM.  Appropriate orders placed.  Teja Costen was informed that the remainder of the evaluation will be completed by another provider, this initial triage assessment does not replace that evaluation, and the importance of remaining in the ED until their evaluation is complete.   Rayna Sexton, PA-C 04/02/21 1546    Charlesetta Shanks, MD 04/02/21 1642    Lacretia Leigh, MD 04/06/21 1558

## 2021-04-02 NOTE — Consult Note (Signed)
Chief Complaint: Left ankle fracture History: Carol [name] , a 50 y.o. female  was evaluated in triage.  Pt complains of pain and swelling to the left distal leg.  Patient states she was playing on monkey bars, slipped, and fell and heard a pop in the region.  Reports immediate pain and swelling.  Obvious deformity.  Denies any numbness.  Review of systems No loss of consciousness, headaches, dizziness, blurry vision.  No nausea or vomiting.  No recent fevers, or chills.  Past Medical History:  Diagnosis Date   Cancer ([place])    skin, basil, squamaous and melanoma, Dr. Asencion Partridge [place]   Complication of anesthesia    pt states woke up during procedure     Allergies  Allergen Reactions   Doxycycline Rash   Keflex [Cephalexin] Rash   Minocycline Rash   Sulfa Antibiotics Rash    Other reaction(s): Other (See Comments), Other (See Comments)   Sulfacetamide Sodium Rash    No current facility-administered medications on file prior to encounter.   Current Outpatient Medications on File Prior to Encounter  Medication Sig Dispense Refill   citalopram (CELEXA) 20 MG tablet Take 20 mg by mouth every other day.     citalopram (CELEXA) 40 MG tablet Take 30 mg by mouth every other day.     fluticasone (FLONASE) 50 MCG/ACT nasal spray Place 1 spray into both nostrils daily as needed for allergies.     Multiple Vitamin (MULTIVITAMIN WITH MINERALS) TABS tablet Take 1 tablet by mouth daily.     Multiple Vitamins-Minerals (HAIR SKIN & NAILS ADVANCED PO) Take 1 tablet by mouth daily.     terbinafine (LAMISIL) 250 MG tablet Take 1 tablet (250 mg total) by mouth daily. 45 tablet 0   scopolamine (TRANSDERM-SCOP) 1 MG/3DAYS Place 1 patch onto the skin every 3 (three) days.      Physical Exam: Vitals:   04/02/21 1800 04/02/21 1802  BP: 109/73 110/73  Pulse: 84 85  Resp: (!) 22 12  Temp:    SpO2: 99% 99%   Body mass index is 20.6 kg/m. Alert and oriented x3.  No shortness of breath or  chest pain.  Abdomen is soft and nontender.  No history of incontinence of bowel or bladder. Lungs clear to auscultation bilaterally Cardiac: Regular rate and rhythm no rubs gallops murmurs Left lower extremity: The ankle is in a plantarflexed position in a posterior splint.  Proximal compartments are soft and nontender.  Cap refills less than 2 seconds.  Sensation in the dorsum of the foot is intact to light touch. No hip or knee pain with isolated palpation or gentle range of motion.  Image: DG Tibia/Fibula Left  Result Date: 04/02/2021 CLINICAL DATA:  Fall. Pain is at ankle in just above the ankle. Pulses intact. EXAM: LEFT ANKLE COMPLETE - 3+ VIEW; LEFT TIBIA AND FIBULA - 2 VIEW COMPARISON:  None. FINDINGS: Overlying splint material obscures fine osseous detail. Comminuted transverse fracture of the distal tibia. The tibial plafond appears intact, however there is probable fracture extension into the distal tibiofibular joint. The distal fracture fragment is displaced laterally approximately 1.4 cm with mild medial apex angulation. No significant impaction or distraction. Comminuted transverse fracture of the distal fibula at the level of the distal tibiofibular joint. The distal fracture fragment is displaced approximately 1 cm laterally with 0.9 cm of impaction. Mild medial apex angulation. Talar dome is intact. No ankle or knee joint dislocation. No other fracture in the  left lower leg. IMPRESSION: 1. Comminuted mildly displaced transverse fracture of the distal tibia, extending to the distal tibiofibular joint. 2. Comminuted mildly displaced transverse fracture of the distal fibula at the level of the distal tibiofibular joint with mild impaction. 3. No ankle or knee joint dislocation. Electronically Signed   By: Ileana Roup M.D.   On: 04/02/2021 16:58   DG Ankle Complete Left  Result Date: 04/02/2021 CLINICAL DATA:  Post reduction EXAM: LEFT ANKLE COMPLETE - 3+ VIEW COMPARISON:  04/02/2021  FINDINGS: Comminuted distal tibial fracture with residual 1/4 shaft diameter lateral and just under 1/4 shaft diameter anterior displacement of distal fracture fragment. Mild lateral angulation which is slightly decreased compared to prior. Acute comminuted distal fibular fracture with residual 1/2 shaft diameter lateral displacement. Residual mild lateral angulation of distal fracture fragment, also decreased compared to prior. Mortise appears symmetric. IMPRESSION: Acute comminuted distal tibial and fibular fractures with residual displacement and angulation, though decreased compared to prior Electronically Signed   By: Donavan Foil M.D.   On: 04/02/2021 18:18   DG Ankle Complete Left  Result Date: 04/02/2021 CLINICAL DATA:  Fall. Pain is at ankle in just above the ankle. Pulses intact. EXAM: LEFT ANKLE COMPLETE - 3+ VIEW; LEFT TIBIA AND FIBULA - 2 VIEW COMPARISON:  None. FINDINGS: Overlying splint material obscures fine osseous detail. Comminuted transverse fracture of the distal tibia. The tibial plafond appears intact, however there is probable fracture extension into the distal tibiofibular joint. The distal fracture fragment is displaced laterally approximately 1.4 cm with mild medial apex angulation. No significant impaction or distraction. Comminuted transverse fracture of the distal fibula at the level of the distal tibiofibular joint. The distal fracture fragment is displaced approximately 1 cm laterally with 0.9 cm of impaction. Mild medial apex angulation. Talar dome is intact. No ankle or knee joint dislocation. No other fracture in the left lower leg. IMPRESSION: 1. Comminuted mildly displaced transverse fracture of the distal tibia, extending to the distal tibiofibular joint. 2. Comminuted mildly displaced transverse fracture of the distal fibula at the level of the distal tibiofibular joint with mild impaction. 3. No ankle or knee joint dislocation. Electronically Signed   By: Ileana Roup  M.D.   On: 04/02/2021 16:58    A/P: Carol [name] is a very pleasant otherwise healthy 50 year old female who was in her usual state of good to excellent health until she fell off of the monkey bars earlier today.  She noted immediate pain and deformity of the left ankle and was unable to ambulate.  In the emergency room imaging studies demonstrated a distal tib-fib fracture with a displaced ankle mortise.  Patient had a closed injury and was neurovascularly intact.  She was given conscious sedation and a gentle manipulation maneuver was applied by the emergency room staff and a splint was applied.  Orthopedic consultation was requested.  Initially was felt as though the patient would be able to be discharged however due to increased pain and the fact that she lives on the second story she felt as though would be too difficult to go home.  I have reviewed the films and the patient's clinical exam and she is neurologically intact her primary complaint is pain at the fracture site.  I have spoken with our foot and ankle specialist and they will evaluate the patient in the morning for surgical fixation of the ankle.  She will remain n.p.o. after midnight tonight.  Patient is going to be admitted to the medical service  we will continue to monitor her progress.  If there is any questions concerns or problems please feel free to contact us at [phone].

## 2021-04-02 NOTE — ED Triage Notes (Signed)
Pt states she jumped off the monkey bars, landed wrong, and heard a "pop" in her left ankle. Pt states the pain is right above the ankle and at the ankle. Pt can move toes. Pulses intact.

## 2021-04-02 NOTE — ED Notes (Addendum)
Consent signed at 1700.  Equipment set up in room (Ambu bag, suction, CO2, etc) ; crash cart set up outside of room  Staff in room include:  Cayman Brogden, RN  Clarise Cruz, RN  Johnney Killian, MD  Ortho tech Respiratory therapy   Timeout performed at 1716 30mg  propofol given at 1717 20mg  propofol given at 1719  20mg  propofol given at 1720   93 HR  128/76 (91)  97% RA 13 RR  20mg  propofol given at 1720   20mg  propofol given at 1721   86 HR  128/69 (83)  94% RA 18 RR   Splinting occurring at 1724  80 HR  116/75 92% RA 13 RR   Xray order placed for confirmation of placement at 1725

## 2021-04-02 NOTE — H&P (Signed)
History and Physical   Carol [name] MGQ:[id number] DOB: 12-16-1970 DOA: 04/02/2021  Referring MD/NP/PA: Dr. Vallery Ridge  PCP: Leighton Ruff, MD   Outpatient Specialists: [facility] Dr. Marcello Moores dermatology,Oncology  Patient coming from: Home  Chief Complaint: Left ankle injury  HPI: Carol [name] is a 50 y.o. female with medical history significant of basal cell carcinoma, depression, anxiety, who presented to the ER after she jumped off of the monkey bars landing on the wrong side injuring her ankle.  She had a pop in the left ankle.  She started having significant pain.  She came into the ER where she was evaluated.  Patient has ankle fracture.  Pain is a 10 out of 10.  At this point this has been reduced and splinted.  Orthopedics consulted with plan to involve ankle surgery tomorrow for possible surgical repair this hospitalization.  She denied any other complaints.  She is having pain at the moment not relieved by most medications.  No fever or chills.  No other injuries..  ED Course: Temperature 98.5, blood pressure 143/85, pulse 128, respirate of 26 and oxygen sat 93% on room air.CBC and chemistry largely within normal.  COVID-19 and influenza screen are negative.  X-ray of the left ankle shows comminuted mildly displaced transverse fracture of the distal tibia extending to the distal tibiofibular joint.  Also commuted mildly displaced transverse fracture of the distal fibula at the end of the distal tibiofibular joint with mild impaction.  No ankle or knee joint dislocation.  Ankle is currently immobilized and patient is being admitted to the hospital for further evaluation and treatment.  Review of Systems: As per HPI otherwise 10 point review of systems negative.    Past Medical History:  Diagnosis Date   Cancer ([place])    skin, basil, squamaous and melanoma, Dr. Asencion Partridge [place]   Complication of anesthesia    pt states woke up during procedure     Past Surgical History:   Procedure Laterality Date   BREAST SURGERY     nov 2014 breast implant DR Stephanie Coup   COLONOSCOPY     x2, polyps resected   COLONOSCOPY WITH PROPOFOL N/A 03/24/2015   Procedure: COLONOSCOPY WITH PROPOFOL;  Surgeon: Garlan Fair, MD;  Location: [facility];  Service: Endoscopy;  Laterality: N/A;   TONSILLECTOMY     WISDOM TOOTH EXTRACTION       reports that she has never smoked. She has never used smokeless tobacco. She reports current alcohol use. She reports that she does not use drugs.  Allergies  Allergen Reactions   Doxycycline Rash   Keflex [Cephalexin] Rash   Minocycline Rash   Sulfa Antibiotics Rash    Other reaction(s): Other (See Comments), Other (See Comments)   Sulfacetamide Sodium Rash    Family History  Adopted: Yes  Problem Relation Age of Onset   Cancer Mother        colon     Prior to Admission medications   Medication Sig Start Date End Date Taking? Authorizing Provider  citalopram (CELEXA) 20 MG tablet Take 20 mg by mouth daily. 02/08/21  Yes [provider]  citalopram (CELEXA) 40 MG tablet Take 30 mg by mouth daily. 03/29/21  Yes [provider]  fluticasone (FLONASE) 50 MCG/ACT nasal spray Place 1 spray into both nostrils daily as needed for allergies. 03/31/21  Yes [provider]  Multiple Vitamin (MULTIVITAMIN WITH MINERALS) TABS tablet Take 1 tablet by mouth daily.   Yes [provider]  Multiple Vitamins-Minerals (HAIR SKIN & NAILS ADVANCED PO) Take 1 tablet by mouth daily.   Yes [provider]  terbinafine (LAMISIL) 250 MG tablet Take 1 tablet (250 mg total) by mouth daily. 02/11/21  Yes Regal, Tamala Fothergill, DPM  scopolamine (TRANSDERM-SCOP) 1 MG/3DAYS Place 1 patch onto the skin every 3 (three) days. 03/19/21   [provider]    Physical Exam: Vitals:   04/02/21 1545 04/02/21 1700 04/02/21 1800 04/02/21 1802  BP:  129/81 109/73 110/73  Pulse:  98 84 85  Resp: 16 18 (!) 22 12  Temp: 98.5 F  (36.9 C)     TempSrc: Oral     SpO2:  97% 99% 99%  Weight:      Height:          Constitutional: Acutely ill looking and in severe distress Vitals:   04/02/21 1545 04/02/21 1700 04/02/21 1800 04/02/21 1802  BP:  129/81 109/73 110/73  Pulse:  98 84 85  Resp: 16 18 (!) 22 12  Temp: 98.5 F (36.9 C)     TempSrc: Oral     SpO2:  97% 99% 99%  Weight:      Height:       Eyes: PERRL, lids and conjunctivae normal ENMT: Mucous membranes are moist. Posterior pharynx clear of any exudate or lesions.Normal dentition.  Neck: normal, supple, no masses, no thyromegaly Respiratory: clear to auscultation bilaterally, no wheezing, no crackles. Normal respiratory effort. No accessory muscle use.  Cardiovascular: Sinus tachycardia, no murmurs / rubs / gallops. No extremity edema. 2+ pedal pulses. No carotid bruits.  Abdomen: no tenderness, no masses palpated. No hepatosplenomegaly. Bowel sounds positive.  Musculoskeletal: Left lower extremity immobilized, left ankle area laterally rotated, fracture site reduced, patient able to wiggle her toes normal muscle tone.  Skin: no rashes, lesions, ulcers. No induration Neurologic: CN 2-12 grossly intact. Sensation intact, DTR normal. Strength 5/5 in all 4.  Psychiatric: Normal judgment and insight. Alert and oriented x 3.  Anxious mood.     Labs on Admission: I have personally reviewed following labs and imaging studies  CBC: Recent Labs  Lab 04/02/21 1644  WBC 6.3  NEUTROABS 4.6  HGB 11.1*  HCT 33.2*  MCV 90.5  PLT 242   Basic Metabolic Panel: Recent Labs  Lab 04/02/21 1644  NA 139  K 3.9  CL 109  CO2 23  GLUCOSE 105*  BUN 25*  CREATININE 0.85  CALCIUM 8.3*   GFR: Estimated Creatinine Clearance: 59.1 mL/min (by C-G formula based on SCr of 0.85 mg/dL). Liver Function Tests: No results for input(s): AST, ALT, ALKPHOS, BILITOT, PROT, ALBUMIN in the last 168 hours. No results for input(s): LIPASE, AMYLASE in the last 168 hours. No  results for input(s): AMMONIA in the last 168 hours. Coagulation Profile: Recent Labs  Lab 04/02/21 1644  INR 1.2   Cardiac Enzymes: No results for input(s): CKTOTAL, CKMB, CKMBINDEX, TROPONINI in the last 168 hours. BNP (last 3 results) No results for input(s): PROBNP in the last 8760 hours. HbA1C: No results for input(s): HGBA1C in the last 72 hours. CBG: No results for input(s): GLUCAP in the last 168 hours. Lipid Profile: No results for input(s): CHOL, HDL, LDLCALC, TRIG, CHOLHDL, LDLDIRECT in the last 72 hours. Thyroid Function Tests: No results for input(s): TSH, T4TOTAL, FREET4, T3FREE, THYROIDAB in the last 72 hours. Anemia Panel: No results for input(s): VITAMINB12, FOLATE, FERRITIN, TIBC, IRON, RETICCTPCT in the last 72 hours. Urine analysis: No results found  for: COLORURINE, APPEARANCEUR, LABSPEC, PHURINE, GLUCOSEU, HGBUR, BILIRUBINUR, KETONESUR, PROTEINUR, UROBILINOGEN, NITRITE, LEUKOCYTESUR Sepsis Labs: @LABRCNTIP (procalcitonin:4,lacticidven:4) ) Recent Results (from the past 240 hour(s))  Resp Panel by RT-PCR (Flu A&B, Covid) Nasopharyngeal Swab     Status: None   Collection Time: 04/02/21  5:14 PM   Specimen: Nasopharyngeal Swab; Nasopharyngeal(NP) swabs in vial transport medium  Result Value Ref Range Status   SARS Coronavirus 2 by RT PCR NEGATIVE NEGATIVE Final    Comment: (NOTE) SARS-CoV-2 target nucleic acids are NOT DETECTED.  The SARS-CoV-2 RNA is generally detectable in upper respiratory specimens during the acute phase of infection. The lowest concentration of SARS-CoV-2 viral copies this assay can detect is 138 copies/mL. A negative result does not preclude SARS-Cov-2 infection and should not be used as the sole basis for treatment or other patient management decisions. A negative result may occur with  improper specimen collection/handling, submission of specimen other than nasopharyngeal swab, presence of viral mutation(s) within the areas targeted  by this assay, and inadequate number of viral copies(<138 copies/mL). A negative result must be combined with clinical observations, patient history, and epidemiological information. The expected result is Negative.  Fact Sheet for Patients:  EntrepreneurPulse.com.au  Fact Sheet for Healthcare Providers:  IncredibleEmployment.be  This test is no t yet approved or cleared by the [place] FDA and  has been authorized for detection and/or diagnosis of SARS-CoV-2 by FDA under an Emergency Use Authorization (EUA). This EUA will remain  in effect (meaning this test can be used) for the duration of the COVID-19 declaration under Section 564(b)(1) of the Act, 21 U.S.C.section 360bbb-3(b)(1), unless the authorization is terminated  or revoked sooner.       Influenza A by PCR NEGATIVE NEGATIVE Final   Influenza B by PCR NEGATIVE NEGATIVE Final    Comment: (NOTE) The Xpert Xpress SARS-CoV-2/FLU/RSV plus assay is intended as an aid in the diagnosis of influenza from Nasopharyngeal swab specimens and should not be used as a sole basis for treatment. Nasal washings and aspirates are unacceptable for Xpert Xpress SARS-CoV-2/FLU/RSV testing.  Fact Sheet for Patients: EntrepreneurPulse.com.au  Fact Sheet for Healthcare Providers: IncredibleEmployment.be  This test is not yet approved or cleared by the [place] FDA and has been authorized for detection and/or diagnosis of SARS-CoV-2 by FDA under an Emergency Use Authorization (EUA). This EUA will remain in effect (meaning this test can be used) for the duration of the COVID-19 declaration under Section 564(b)(1) of the Act, 21 U.S.C. section 360bbb-3(b)(1), unless the authorization is terminated or revoked.  Performed at [facility], [place] [address], [place], [place] [address]      Radiological Exams on Admission: DG Tibia/Fibula  Left  Result Date: 04/02/2021 CLINICAL DATA:  Fall. Pain is at ankle in just above the ankle. Pulses intact. EXAM: LEFT ANKLE COMPLETE - 3+ VIEW; LEFT TIBIA AND FIBULA - 2 VIEW COMPARISON:  None. FINDINGS: Overlying splint material obscures fine osseous detail. Comminuted transverse fracture of the distal tibia. The tibial plafond appears intact, however there is probable fracture extension into the distal tibiofibular joint. The distal fracture fragment is displaced laterally approximately 1.4 cm with mild medial apex angulation. No significant impaction or distraction. Comminuted transverse fracture of the distal fibula at the level of the distal tibiofibular joint. The distal fracture fragment is displaced approximately 1 cm laterally with 0.9 cm of impaction. Mild medial apex angulation. Talar dome is intact. No ankle or knee joint dislocation. No other fracture in the left lower  leg. IMPRESSION: 1. Comminuted mildly displaced transverse fracture of the distal tibia, extending to the distal tibiofibular joint. 2. Comminuted mildly displaced transverse fracture of the distal fibula at the level of the distal tibiofibular joint with mild impaction. 3. No ankle or knee joint dislocation. Electronically Signed   By: Ileana Roup M.D.   On: 04/02/2021 16:58   DG Ankle Complete Left  Result Date: 04/02/2021 CLINICAL DATA:  Post reduction EXAM: LEFT ANKLE COMPLETE - 3+ VIEW COMPARISON:  04/02/2021 FINDINGS: Comminuted distal tibial fracture with residual 1/4 shaft diameter lateral and just under 1/4 shaft diameter anterior displacement of distal fracture fragment. Mild lateral angulation which is slightly decreased compared to prior. Acute comminuted distal fibular fracture with residual 1/2 shaft diameter lateral displacement. Residual mild lateral angulation of distal fracture fragment, also decreased compared to prior. Mortise appears symmetric. IMPRESSION: Acute comminuted distal tibial and fibular fractures  with residual displacement and angulation, though decreased compared to prior Electronically Signed   By: Donavan Foil M.D.   On: 04/02/2021 18:18   DG Ankle Complete Left  Result Date: 04/02/2021 CLINICAL DATA:  Fall. Pain is at ankle in just above the ankle. Pulses intact. EXAM: LEFT ANKLE COMPLETE - 3+ VIEW; LEFT TIBIA AND FIBULA - 2 VIEW COMPARISON:  None. FINDINGS: Overlying splint material obscures fine osseous detail. Comminuted transverse fracture of the distal tibia. The tibial plafond appears intact, however there is probable fracture extension into the distal tibiofibular joint. The distal fracture fragment is displaced laterally approximately 1.4 cm with mild medial apex angulation. No significant impaction or distraction. Comminuted transverse fracture of the distal fibula at the level of the distal tibiofibular joint. The distal fracture fragment is displaced approximately 1 cm laterally with 0.9 cm of impaction. Mild medial apex angulation. Talar dome is intact. No ankle or knee joint dislocation. No other fracture in the left lower leg. IMPRESSION: 1. Comminuted mildly displaced transverse fracture of the distal tibia, extending to the distal tibiofibular joint. 2. Comminuted mildly displaced transverse fracture of the distal fibula at the level of the distal tibiofibular joint with mild impaction. 3. No ankle or knee joint dislocation. Electronically Signed   By: Ileana Roup M.D.   On: 04/02/2021 16:58    EKG: Independently reviewed.  Sinus tachycardia otherwise no significant findings  Assessment/Plan Principal Problem:   Closed left ankle fracture Active Problems:   Anxiety   Gastroesophageal reflux disease   Grover's disease   Hypercholesterolemia   Seborrheic keratosis   Restless legs syndrome     #1 left ankle fracture: Involves the distal tibiofibular region.  Patient will be admitted.  Pain control.  Supportive care.  Orthopedics already consulted and will follow the  recommendations.  #2 GERD: Continue with PPIs.  #3 anxiety disorder: Continue anxiolytics and citalopram  #4 seborrheic keratosis: Continue Lamisil  #5 restless leg syndrome: Does not appear to be on any home regimen.  Continue to monitor  #6 hyperlipidemia: Not on a statin.    DVT prophylaxis: Lovenox Code Status: Full code Family Communication: Husband at bedside Disposition Plan: Home Consults called: Dr. Rolena Infante, orthopedics Admission status: Inpatient  Severity of Illness: The appropriate patient status for this patient is INPATIENT. Inpatient status is judged to be reasonable and necessary in order to provide the required intensity of service to ensure the patient's safety. The patient's presenting symptoms, physical exam findings, and initial radiographic and laboratory data in the context of their chronic comorbidities is felt to place them at high  risk for further clinical deterioration. Furthermore, it is not anticipated that the patient will be medically stable for discharge from the hospital within 2 midnights of admission. The following factors support the patient status of inpatient.   " The patient's presenting symptoms include left ankles injury with pain. " The worrisome physical exam findings include laterally rotated left and will immobilize swollen tender. " The initial radiographic and laboratory data are worrisome because of fracture of the distal tibia-fibula bones. " The chronic co-morbidities include essential hypertension.   * I certify that at the point of admission it is my clinical judgment that the patient will require inpatient hospital care spanning beyond 2 midnights from the point of admission due to high intensity of service, high risk for further deterioration and high frequency of surveillance required.Barbette Merino MD [facility] Pager [phone]  If 7PM-7AM, please contact night-coverage [email or web address] Password [email or web address]  04/02/2021,  7:51 PM

## 2021-04-02 NOTE — ED Provider Notes (Signed)
[place] DEPT Provider Note   CSN: [id number] Arrival date & time: 04/02/21  1527     History Chief Complaint  Patient presents with   Ankle Pain    Dajsha Bodin is a 50 y.o. female.  HPI She was hanging on monkey bars and lost her grip falling about a foot and half.  When she landed, her left ankle and foot gave way.  She reports that it is severely painful and displaced.  She heard a pop.  Pain was immediate.  She could not bear weight.  No numbness to the extremity.  No other injuries occurred.    Past Medical History:  Diagnosis Date   Cancer ([place])    skin, basil, squamaous and melanoma, Dr. Asencion Partridge [place]   Complication of anesthesia    pt states woke up during procedure     Patient Active Problem List   Diagnosis Date Noted   External hemorrhoid, thrombosed 02/11/2014    Past Surgical History:  Procedure Laterality Date   BREAST SURGERY     nov 2014 breast implant DR Stephanie Coup   COLONOSCOPY     x2, polyps resected   COLONOSCOPY WITH PROPOFOL N/A 03/24/2015   Procedure: COLONOSCOPY WITH PROPOFOL;  Surgeon: Garlan Fair, MD;  Location: [facility];  Service: Endoscopy;  Laterality: N/A;   TONSILLECTOMY       OB History   No obstetric history on file.     Family History  Adopted: Yes  Problem Relation Age of Onset   Cancer Mother        colon    Social History   Tobacco Use   Smoking status: Never   Smokeless tobacco: Never  Substance Use Topics   Alcohol use: Yes    Comment: socially   Drug use: No    Home Medications Prior to Admission medications   Medication Sig Start Date End Date Taking? Authorizing Provider  citalopram (CELEXA) 20 MG tablet Take by mouth. 02/08/21   [provider]  terbinafine (LAMISIL) 250 MG tablet Take 1 tablet (250 mg total) by mouth daily. 02/11/21   Wallene Huh, DPM    Allergies    Doxycycline, Keflex [cephalexin], Sulfa antibiotics, and Sulfacetamide  sodium  Review of Systems   Review of Systems 10 systems reviewed negative except as per HPI Physical Exam Updated Vital Signs BP 129/81   Pulse 98   Temp 98.5 F (36.9 C) (Oral)   Resp 18   Ht 5\' 1"  (1.549 m)   Wt 49.4 kg   SpO2 97%   BMI 20.60 kg/m   Physical Exam Constitutional:      Comments: Alert nontoxic clear mental status severe pain  HENT:     Head: Normocephalic and atraumatic.     Mouth/Throat:     Pharynx: Oropharynx is clear.  Eyes:     Extraocular Movements: Extraocular movements intact.  Cardiovascular:     Rate and Rhythm: Normal rate and regular rhythm.  Pulmonary:     Effort: Pulmonary effort is normal.     Breath sounds: Normal breath sounds.  Chest:     Chest wall: No tenderness.  Abdominal:     General: There is no distension.     Palpations: Abdomen is soft.     Tenderness: There is no abdominal tenderness.  Musculoskeletal:     Cervical back: Neck supple.     Comments: Isolated significantly angulated left ankle deformity.  Neurovascularly intact.  Skin surfaces intact.  Knee and hip without deformity or effusion    ED Results / Procedures / Treatments   Labs (all labs ordered are listed, but only abnormal results are displayed) Labs Reviewed  RESP PANEL BY RT-PCR (FLU A&B, COVID) [id number]  BASIC METABOLIC PANEL  CBC WITH DIFFERENTIAL/PLATELET  PROTIME-INR    EKG None  Radiology DG Tibia/Fibula Left  Result Date: 04/02/2021 CLINICAL DATA:  Fall. Pain is at ankle in just above the ankle. Pulses intact. EXAM: LEFT ANKLE COMPLETE - 3+ VIEW; LEFT TIBIA AND FIBULA - 2 VIEW COMPARISON:  None. FINDINGS: Overlying splint material obscures fine osseous detail. Comminuted transverse fracture of the distal tibia. The tibial plafond appears intact, however there is probable fracture extension into the distal tibiofibular joint. The distal fracture fragment is displaced laterally approximately 1.4 cm with mild medial apex angulation. No significant  impaction or distraction. Comminuted transverse fracture of the distal fibula at the level of the distal tibiofibular joint. The distal fracture fragment is displaced approximately 1 cm laterally with 0.9 cm of impaction. Mild medial apex angulation. Talar dome is intact. No ankle or knee joint dislocation. No other fracture in the left lower leg. IMPRESSION: 1. Comminuted mildly displaced transverse fracture of the distal tibia, extending to the distal tibiofibular joint. 2. Comminuted mildly displaced transverse fracture of the distal fibula at the level of the distal tibiofibular joint with mild impaction. 3. No ankle or knee joint dislocation. Electronically Signed   By: Ileana Roup M.D.   On: 04/02/2021 16:58   DG Ankle Complete Left  Result Date: 04/02/2021 CLINICAL DATA:  Fall. Pain is at ankle in just above the ankle. Pulses intact. EXAM: LEFT ANKLE COMPLETE - 3+ VIEW; LEFT TIBIA AND FIBULA - 2 VIEW COMPARISON:  None. FINDINGS: Overlying splint material obscures fine osseous detail. Comminuted transverse fracture of the distal tibia. The tibial plafond appears intact, however there is probable fracture extension into the distal tibiofibular joint. The distal fracture fragment is displaced laterally approximately 1.4 cm with mild medial apex angulation. No significant impaction or distraction. Comminuted transverse fracture of the distal fibula at the level of the distal tibiofibular joint. The distal fracture fragment is displaced approximately 1 cm laterally with 0.9 cm of impaction. Mild medial apex angulation. Talar dome is intact. No ankle or knee joint dislocation. No other fracture in the left lower leg. IMPRESSION: 1. Comminuted mildly displaced transverse fracture of the distal tibia, extending to the distal tibiofibular joint. 2. Comminuted mildly displaced transverse fracture of the distal fibula at the level of the distal tibiofibular joint with mild impaction. 3. No ankle or knee joint  dislocation. Electronically Signed   By: Ileana Roup M.D.   On: 04/02/2021 16:58    Procedures .Ortho Injury Treatment  Date/Time: 04/02/2021 5:35 PM Performed by: Charlesetta Shanks, MD Authorized by: Charlesetta Shanks, MD   Consent:    Consent obtained:  Verbal   Consent given by:  Patient   Risks discussed:  FractureInjury location: ankle Injury type: fracture Fracture type: bimalleolar Pre-procedure neurovascular assessment: neurovascularly intact Pre-procedure distal perfusion: normal Pre-procedure neurological function: normal Pre-procedure range of motion: reduced  Anesthesia: Local anesthesia used: no  Patient sedated: Yes. Refer to sedation procedure documentation for details of sedation. Manipulation performed: yes Skeletal traction used: yes Reduction successful: yes X-ray confirmed reduction: yes Immobilization: splint Splint type: ankle stirrup and short leg Splint Applied by: ED Provider and ED Tech Supplies used: cotton padding and plaster Post-procedure distal perfusion: normal Post-procedure neurological function: normal  Post-procedure range of motion: improved   .Sedation  Date/Time: 04/02/2021 5:37 PM Performed by: Charlesetta Shanks, MD Authorized by: Charlesetta Shanks, MD   Consent:    Consent obtained:  Verbal   Consent given by:  Patient   Risks discussed:  Allergic reaction, inadequate sedation, dysrhythmia, respiratory compromise necessitating ventilatory assistance and intubation, prolonged sedation necessitating reversal, prolonged hypoxia resulting in organ damage, vomiting and nausea Universal protocol:    Immediately prior to procedure, a time out was called: yes     Patient identity confirmed:  Arm band and verbally with patient Indications:    Procedure performed:  Fracture reduction   Procedure necessitating sedation performed by:  Physician performing sedation Pre-sedation assessment:    Time since last food or drink:  2:30pm   ASA  classification: class 1 - normal, healthy patient     Mouth opening:  3 or more finger widths   Thyromental distance:  4 finger widths   Mallampati score:  I - soft palate, uvula, fauces, pillars visible   Neck mobility: normal     Pre-sedation assessments completed and reviewed: airway patency, cardiovascular function, hydration status, mental status, nausea/vomiting, pain level and respiratory function   Immediate pre-procedure details:    Reassessment: Patient reassessed immediately prior to procedure     Reviewed: vital signs and relevant labs/tests     Verified: bag valve mask available, emergency equipment available, intubation equipment available, IV patency confirmed, oxygen available, reversal medications available and suction available   Procedure details (see MAR for exact dosages):    Preoxygenation:  Nasal cannula   Sedation:  Propofol   Intended level of sedation: deep   Intra-procedure monitoring:  Blood pressure monitoring, cardiac monitor, continuous pulse oximetry, continuous capnometry, frequent LOC assessments and frequent vital sign checks   Intra-procedure events: none     Total Provider sedation time (minutes):  20 Post-procedure details:    Post-sedation assessment completed:  04/02/2021 5:40 PM   Attendance: Constant attendance by certified staff until patient recovered     Recovery: Patient returned to pre-procedure baseline     Post-sedation assessments completed and reviewed: airway patency, cardiovascular function, hydration status, mental status, nausea/vomiting, pain level and respiratory function     Patient is stable for discharge or admission: yes     Procedure completion:  Tolerated well, no immediate complications   Medications Ordered in ED Medications  fentaNYL (SUBLIMAZE) injection 100 mcg (100 mcg Intravenous Given 04/02/21 1555)  ondansetron (ZOFRAN) injection 4 mg (4 mg Intravenous Given 04/02/21 1656)  HYDROmorphone (DILAUDID) injection 1 mg (1 mg  Intravenous Given 04/02/21 1654)  0.9 %  sodium chloride infusion ( Intravenous New Bag/Given 04/02/21 1714)  propofol (DIPRIVAN) 10 mg/mL bolus/IV push 24.7 mg (110 mg Intravenous Given 04/02/21 1725)    ED Course  I have reviewed the triage vital signs and the nursing notes.  Pertinent labs & imaging results that were available during my care of the patient were reviewed by me and considered in my medical decision making (see chart for details).    MDM Rules/Calculators/A&P                           Consult: Reviewed with Dr. Rolena Infante orthopedics.  Requests ankle be reduced and splinted.  He will review postreduction x-rays for further instructions  X-rays reviewed by myself.  X-rays reviewed showing significant displaced distal ankle fracture with tib-fib comminuted fractures but no dislocation.  Postreduction x-rays reviewed by  myself shows significantly improved alignment in splint.  Patient presents with isolated ankle fracture.  He is otherwise healthy.  Patient sedated for reduction.  He had significantly angulated ankle fracture without skin disruption.  Patient is neurovascular tact upon arrival.  Ankle reduced and splinted.  Patient reports she is much more comfortable after reduction and splinting.  Patient reports will have significant difficulty with mobility at home.  Prefers immediate inpatient management for pain control and surgical treatment. Final Clinical Impression(s) / ED Diagnoses Final diagnoses:  Closed fracture of left ankle, initial encounter    Rx / DC Orders ED Discharge Orders     None        Charlesetta Shanks, MD 04/13/21 1546

## 2021-04-03 ENCOUNTER — Inpatient Hospital Stay (HOSPITAL_COMMUNITY): Payer: BC Managed Care – PPO

## 2021-04-03 DIAGNOSIS — F419 Anxiety disorder, unspecified: Secondary | ICD-10-CM | POA: Diagnosis not present

## 2021-04-03 DIAGNOSIS — K219 Gastro-esophageal reflux disease without esophagitis: Secondary | ICD-10-CM | POA: Diagnosis not present

## 2021-04-03 DIAGNOSIS — S82892A Other fracture of left lower leg, initial encounter for closed fracture: Secondary | ICD-10-CM | POA: Diagnosis not present

## 2021-04-03 DIAGNOSIS — E7800 Pure hypercholesterolemia, unspecified: Secondary | ICD-10-CM | POA: Diagnosis not present

## 2021-04-03 LAB — CBC
HCT: 34.8 % — ABNORMAL LOW (ref 36.0–46.0)
Hemoglobin: 11.4 g/dL — ABNORMAL LOW (ref 12.0–15.0)
MCH: 30.2 pg (ref 26.0–34.0)
MCHC: 32.8 g/dL (ref 30.0–36.0)
MCV: 92.3 fL (ref 80.0–100.0)
Platelets: 154 10*3/uL (ref 150–400)
RBC: 3.77 MIL/uL — ABNORMAL LOW (ref 3.87–5.11)
RDW: 12.3 % (ref 11.5–15.5)
WBC: 7.5 10*3/uL (ref 4.0–10.5)
nRBC: 0 % (ref 0.0–0.2)

## 2021-04-03 LAB — COMPREHENSIVE METABOLIC PANEL
ALT: 19 U/L (ref 0–44)
AST: 28 U/L (ref 15–41)
Albumin: 4 g/dL (ref 3.5–5.0)
Alkaline Phosphatase: 47 U/L (ref 38–126)
Anion gap: 6 (ref 5–15)
BUN: 19 mg/dL (ref 6–20)
CO2: 26 mmol/L (ref 22–32)
Calcium: 8.7 mg/dL — ABNORMAL LOW (ref 8.9–10.3)
Chloride: 106 mmol/L (ref 98–111)
Creatinine, Ser: 0.83 mg/dL (ref 0.44–1.00)
GFR, Estimated: >60 mL/min (ref >60)
Glucose, Bld: 126 mg/dL — ABNORMAL HIGH (ref 70–99)
Potassium: 4.5 mmol/L (ref 3.5–5.1)
Sodium: 138 mmol/L (ref 135–145)
Total Bilirubin: 0.6 mg/dL (ref 0.3–1.2)
Total Protein: 6.5 g/dL (ref 6.5–8.1)

## 2021-04-03 LAB — HIV ANTIBODY (ROUTINE TESTING W REFLEX): HIV Screen 4th Generation wRfx: NONREACTIVE

## 2021-04-03 MED ORDER — CITALOPRAM HYDROBROMIDE 10 MG PO TABS
20.0000 mg | ORAL_TABLET | Freq: Once | ORAL | Status: AC
  Administered 2021-04-03: 20 mg via ORAL
  Filled 2021-04-03: qty 2

## 2021-04-03 MED ORDER — CITALOPRAM HYDROBROMIDE 10 MG PO TABS
10.0000 mg | ORAL_TABLET | Freq: Once | ORAL | Status: AC
  Administered 2021-04-03: 10 mg via ORAL
  Filled 2021-04-03: qty 1

## 2021-04-03 MED ORDER — HYDROMORPHONE HCL 1 MG/ML IJ SOLN
1.0000 mg | INTRAMUSCULAR | Status: DC | PRN
  Administered 2021-04-03: 2 mg via INTRAVENOUS
  Administered 2021-04-03: 1 mg via INTRAVENOUS
  Administered 2021-04-03: 2 mg via INTRAVENOUS
  Administered 2021-04-03 (×2): 1 mg via INTRAVENOUS
  Administered 2021-04-04 (×5): 2 mg via INTRAVENOUS
  Filled 2021-04-03: qty 2
  Filled 2021-04-03: qty 1
  Filled 2021-04-03 (×3): qty 2
  Filled 2021-04-03: qty 1
  Filled 2021-04-03 (×4): qty 2

## 2021-04-03 MED ORDER — ENOXAPARIN SODIUM 40 MG/0.4ML IJ SOSY
40.0000 mg | PREFILLED_SYRINGE | INTRAMUSCULAR | Status: DC
  Administered 2021-04-03: 40 mg via SUBCUTANEOUS
  Filled 2021-04-03: qty 0.4

## 2021-04-03 NOTE — ED Notes (Signed)
MD Kathaleen Bury at bedside

## 2021-04-03 NOTE — ED Notes (Signed)
Patient transported to CT 

## 2021-04-03 NOTE — ED Notes (Signed)
[facility] called for transport to [facility]

## 2021-04-03 NOTE — Progress Notes (Addendum)
PROGRESS NOTE    Carol [name]  KDX:[id number]  DOB: 12/06/1970  PCP: Leighton Ruff, MD [place] date:04/02/2021 Chief compliant: left ankle pain 50 year old female  with medical history significant of GERD, hyperlipidemia, depression, anxiety presented to the ED after fell off of the monkey bars , experienced immediate pain and deformity of the left ankle and was unable to ambulate.  In the emergency room imaging studies demonstrated a distal tib-fib fracture .  ED Course: Afebrile,vitals stable.  She was given conscious sedation and a gentle manipulation maneuver was applied by the emergency room staff and a splint was applied.  Orthopedic consultation was requested. Initially was felt as though the patient would be able to be discharged however due to increased pain and the fact that she lives on the second story she felt as though would be too difficult to go home--ortho recommended to admit to medicine and consult Foot/ankle surgery for possible ORIF as inpatient Hospital course: Patient admitted to [facility]  with Foot/ankle surgery consult, NPO and supportive management.   Subjective:  Patient still in ED, seen for follow-up.  Daughter at bedside.  Patient reporting 7/10 pain in left lower extremity.  Currently left ankle/lower extremity in a splint.  Possible plan for OR today and n.p.o. currently.  Objective: Vitals:   04/03/21 0330 04/03/21 0400 04/03/21 0630 04/03/21 0800  BP: 116/86 122/82 114/77 120/78  Pulse: 76 72 65 89  Resp: 14 11 17 14   Temp:      TempSrc:      SpO2: 99% 98% 100% 98%  Weight:      Height:        Intake/Output Summary (Last 24 hours) at 04/03/2021 1005 Last data filed at 04/03/2021 0835 Gross per 24 hour  Intake 100.12 ml  Output --  Net 100.12 ml   Filed Weights   04/02/21 1544  Weight: 49.4 kg    Physical Examination:  General: Appears in mild distress due to pain, has lights off and trying to sleep. Head ENT: Atraumatic normocephalic, PERRLA,  neck supple Heart: S1-S2 heard, regular rate and rhythm, no murmurs.  No leg edema noted Lungs: Equal air entry bilaterally, no rhonchi or rales on exam, no accessory muscle use Abdomen: Bowel sounds heard, soft, nontender, nondistended. No organomegaly.  No CVA tenderness Extremities: No pedal edema.  Left ankle fracture, left lower extremity splint in place Neurological: Awake alert oriented x3, no focal weakness or numbness, strength and sensations to crude touch intact Skin: No wounds or rashes.     Data Reviewed: I have personally reviewed following labs and imaging studies  CBC: Recent Labs  Lab 04/02/21 1644 04/02/21 2253 04/03/21 0449  WBC 6.3 8.1 7.5  NEUTROABS 4.6  --   --   HGB 11.1* 11.2* 11.4*  HCT 33.2* 33.7* 34.8*  MCV 90.5 91.3 92.3  PLT 154 161 976   Basic Metabolic Panel: Recent Labs  Lab 04/02/21 1644 04/02/21 2253 04/03/21 0449  NA 139  --  138  K 3.9  --  4.5  CL 109  --  106  CO2 23  --  26  GLUCOSE 105*  --  126*  BUN 25*  --  19  CREATININE 0.85 0.91 0.83  CALCIUM 8.3*  --  8.7*   GFR: Estimated Creatinine Clearance: 60.5 mL/min (by C-G formula based on SCr of 0.83 mg/dL). Liver Function Tests: Recent Labs  Lab 04/03/21 0449  AST 28  ALT 19  ALKPHOS 47  BILITOT 0.6  PROT 6.5  ALBUMIN 4.0   No results for input(s): LIPASE, AMYLASE in the last 168 hours. No results for input(s): AMMONIA in the last 168 hours. Coagulation Profile: Recent Labs  Lab 04/02/21 1644  INR 1.2   Cardiac Enzymes: No results for input(s): CKTOTAL, CKMB, CKMBINDEX, TROPONINI in the last 168 hours. BNP (last 3 results) No results for input(s): PROBNP in the last 8760 hours. HbA1C: No results for input(s): HGBA1C in the last 72 hours. CBG: No results for input(s): GLUCAP in the last 168 hours. Lipid Profile: No results for input(s): CHOL, HDL, LDLCALC, TRIG, CHOLHDL, LDLDIRECT in the last 72 hours. Thyroid Function Tests: No results for input(s): TSH,  T4TOTAL, FREET4, T3FREE, THYROIDAB in the last 72 hours. Anemia Panel: No results for input(s): VITAMINB12, FOLATE, FERRITIN, TIBC, IRON, RETICCTPCT in the last 72 hours. Sepsis Labs: No results for input(s): PROCALCITON, LATICACIDVEN in the last 168 hours.  Recent Results (from the past 240 hour(s))  Resp Panel by RT-PCR (Flu A&B, Covid) Nasopharyngeal Swab     Status: None   Collection Time: 04/02/21  5:14 PM   Specimen: Nasopharyngeal Swab; Nasopharyngeal(NP) swabs in vial transport medium  Result Value Ref Range Status   SARS Coronavirus 2 by RT PCR NEGATIVE NEGATIVE Final    Comment: (NOTE) SARS-CoV-2 target nucleic acids are NOT DETECTED.  The SARS-CoV-2 RNA is generally detectable in upper respiratory specimens during the acute phase of infection. The lowest concentration of SARS-CoV-2 viral copies this assay can detect is 138 copies/mL. A negative result does not preclude SARS-Cov-2 infection and should not be used as the sole basis for treatment or other patient management decisions. A negative result may occur with  improper specimen collection/handling, submission of specimen other than nasopharyngeal swab, presence of viral mutation(s) within the areas targeted by this assay, and inadequate number of viral copies(<138 copies/mL). A negative result must be combined with clinical observations, patient history, and epidemiological information. The expected result is Negative.  Fact Sheet for Patients:  EntrepreneurPulse.com.au  Fact Sheet for Healthcare Providers:  IncredibleEmployment.be  This test is no t yet approved or cleared by the [place] FDA and  has been authorized for detection and/or diagnosis of SARS-CoV-2 by FDA under an Emergency Use Authorization (EUA). This EUA will remain  in effect (meaning this test can be used) for the duration of the COVID-19 declaration under Section 564(b)(1) of the Act, 21 U.S.C.section  360bbb-3(b)(1), unless the authorization is terminated  or revoked sooner.       Influenza A by PCR NEGATIVE NEGATIVE Final   Influenza B by PCR NEGATIVE NEGATIVE Final    Comment: (NOTE) The Xpert Xpress SARS-CoV-2/FLU/RSV plus assay is intended as an aid in the diagnosis of influenza from Nasopharyngeal swab specimens and should not be used as a sole basis for treatment. Nasal washings and aspirates are unacceptable for Xpert Xpress SARS-CoV-2/FLU/RSV testing.  Fact Sheet for Patients: EntrepreneurPulse.com.au  Fact Sheet for Healthcare Providers: IncredibleEmployment.be  This test is not yet approved or cleared by the [place] FDA and has been authorized for detection and/or diagnosis of SARS-CoV-2 by FDA under an Emergency Use Authorization (EUA). This EUA will remain in effect (meaning this test can be used) for the duration of the COVID-19 declaration under Section 564(b)(1) of the Act, 21 U.S.C. section 360bbb-3(b)(1), unless the authorization is terminated or revoked.  Performed at [facility], [place] [address], [place], [place] [address]       Radiology Studies: DG Tibia/Fibula Left  Result  Date: 04/02/2021 CLINICAL DATA:  Fall. Pain is at ankle in just above the ankle. Pulses intact. EXAM: LEFT ANKLE COMPLETE - 3+ VIEW; LEFT TIBIA AND FIBULA - 2 VIEW COMPARISON:  None. FINDINGS: Overlying splint material obscures fine osseous detail. Comminuted transverse fracture of the distal tibia. The tibial plafond appears intact, however there is probable fracture extension into the distal tibiofibular joint. The distal fracture fragment is displaced laterally approximately 1.4 cm with mild medial apex angulation. No significant impaction or distraction. Comminuted transverse fracture of the distal fibula at the level of the distal tibiofibular joint. The distal fracture fragment is displaced approximately 1 cm laterally  with 0.9 cm of impaction. Mild medial apex angulation. Talar dome is intact. No ankle or knee joint dislocation. No other fracture in the left lower leg. IMPRESSION: 1. Comminuted mildly displaced transverse fracture of the distal tibia, extending to the distal tibiofibular joint. 2. Comminuted mildly displaced transverse fracture of the distal fibula at the level of the distal tibiofibular joint with mild impaction. 3. No ankle or knee joint dislocation. Electronically Signed   By: Ileana Roup M.D.   On: 04/02/2021 16:58   DG Ankle Complete Left  Result Date: 04/02/2021 CLINICAL DATA:  Post reduction EXAM: LEFT ANKLE COMPLETE - 3+ VIEW COMPARISON:  04/02/2021 FINDINGS: Comminuted distal tibial fracture with residual 1/4 shaft diameter lateral and just under 1/4 shaft diameter anterior displacement of distal fracture fragment. Mild lateral angulation which is slightly decreased compared to prior. Acute comminuted distal fibular fracture with residual 1/2 shaft diameter lateral displacement. Residual mild lateral angulation of distal fracture fragment, also decreased compared to prior. Mortise appears symmetric. IMPRESSION: Acute comminuted distal tibial and fibular fractures with residual displacement and angulation, though decreased compared to prior Electronically Signed   By: Donavan Foil M.D.   On: 04/02/2021 18:18   DG Ankle Complete Left  Result Date: 04/02/2021 CLINICAL DATA:  Fall. Pain is at ankle in just above the ankle. Pulses intact. EXAM: LEFT ANKLE COMPLETE - 3+ VIEW; LEFT TIBIA AND FIBULA - 2 VIEW COMPARISON:  None. FINDINGS: Overlying splint material obscures fine osseous detail. Comminuted transverse fracture of the distal tibia. The tibial plafond appears intact, however there is probable fracture extension into the distal tibiofibular joint. The distal fracture fragment is displaced laterally approximately 1.4 cm with mild medial apex angulation. No significant impaction or distraction.  Comminuted transverse fracture of the distal fibula at the level of the distal tibiofibular joint. The distal fracture fragment is displaced approximately 1 cm laterally with 0.9 cm of impaction. Mild medial apex angulation. Talar dome is intact. No ankle or knee joint dislocation. No other fracture in the left lower leg. IMPRESSION: 1. Comminuted mildly displaced transverse fracture of the distal tibia, extending to the distal tibiofibular joint. 2. Comminuted mildly displaced transverse fracture of the distal fibula at the level of the distal tibiofibular joint with mild impaction. 3. No ankle or knee joint dislocation. Electronically Signed   By: Ileana Roup M.D.   On: 04/02/2021 16:58      Scheduled Meds:  [START ON 04/04/2021] citalopram  20 mg Oral QODAY   citalopram  30 mg Oral QODAY   enoxaparin (LOVENOX) injection  40 mg Subcutaneous Q24H   multivitamin with minerals  1 tablet Oral Daily   terbinafine  250 mg Oral Daily   Continuous Infusions:  lactated ringers Stopped (04/03/21 0443)   methocarbamol (ROBAXIN) IV Stopped (04/03/21 0835)      Assessment/Plan:  #1 Left  ankle fracture: Involves the distal tibiofibular region.  Patient admitted to medical service with supportive management.  Patient currently has a splint in place and is resting her left lower extremity on a pillow.  Has been utilizing IV Toradol for moderate pain and IV Dilaudid (0.5 to 1 mg) as needed for severe pain but still reporting 7/10 pain, will increase Dilaudid dosage .  Continue IV methocarbamol for muscle spasms as needed.  Possible ORIF today (depending on OR schedule).  Can add scheduled acetaminophen post surgery when able to take p.o.   #2 GERD: Continue with PPIs.   #3 Anxiety disorder: Resume meds when able to take p.o.  IV meds as needed for now.   #4 Seborrheic keratosis: Resume Lamisil when able to take p.o.   #5 Restless leg syndrome: Does not appear to be on any home regimen.  Continue to  monitor   #6 Hyperlipidemia: Not on a statin.  DVT prophylaxis: Hold Lovenox for possible OR today Code Status: Full code Family / Patient Communication: Discussed with patient and daughter at bedside Disposition Plan:   Status is: Inpatient  Remains inpatient appropriate because:Inpatient level of care appropriate due to severity of illness  Dispo: The patient is from: Home              Anticipated d/c is to: Home              Patient currently is not medically stable to d/c.   Difficult to place patient No   Time spent: 25 min  >50% time spent in discussions with care team and coordination of care.  Addendum: Received call from Dr Kathaleen Bury who stated patient not going to OR today. May need to transfer to [facility] if surgery felt to be complicated and expertise unavailable here--He will d/w Trauma Sx at [facility] and let us know. Ordered diet   Guilford Shi, MD [facility] Pager in [place]  If 7PM-7AM, please contact night-coverage [email or web address] 04/03/2021, 10:05 AM

## 2021-04-03 NOTE — ED Notes (Signed)
[facility] at bedside 

## 2021-04-03 NOTE — Progress Notes (Addendum)
ORTHOPAEDIC SURGERY [facility]  Subjective: Patient currently admitted to Hospitalist team at [facility] for IV pain control following left distal tibia-fibula closed fracture. Comfortable at bedside.   Objective: Vital signs in last 24 hours: Pulse Rate:  [65-89] 79 (10/07 1659) Resp:  [11-26] 12 (10/07 1659) BP: (106-137)/(67-97) 113/74 (10/07 1659) SpO2:  [93 %-100 %] 98 % (10/07 1659) Weight:  [49.4 kg] 49.4 kg (10/07 1659)  Intake/Output from previous day: 10/06 0701 - 10/07 0700 In: 50.1 [IV Piggyback:50.1] Out: -  Intake/Output this shift: Total I/O In: 50 [IV Piggyback:50] Out: -   Recent Labs    04/02/21 1644 04/02/21 2253 04/03/21 0449  HGB 11.1* 11.2* 11.4*   Recent Labs    04/02/21 2253 04/03/21 0449  WBC 8.1 7.5  RBC 3.69* 3.77*  HCT 33.7* 34.8*  PLT 161 154   Recent Labs    04/02/21 1644 04/02/21 2253 04/03/21 0449  NA 139  --  138  K 3.9  --  4.5  CL 109  --  106  CO2 23  --  26  BUN 25*  --  19  CREATININE 0.85 0.91 0.83  GLUCOSE 105*  --  126*  CALCIUM 8.3*  --  8.7*   Recent Labs    04/02/21 1644  INR 1.2   Physical Exam: LLE: Splint in place Wiggles toes No pain with passive stretch Compartments soft to palpation CR<2s toes, SILT  Assessment/Plan: -pt will require ORIF of left distal tibia and fibula fracture -scheduled & consented for OR at [facility] tomorrow 04/04/21 at 0730 with myself, Dr. Armond Hang -completed CT of left ankle without contrast confirming no intra-articular extension of fracture -pt will require transfer to [place] ahead of surgery in the AM (discussed with hospitalist team) -NPO at midnight -hold all anticoagulation for surgery   Armond Hang 04/03/2021, 6:11 PM  [phone]

## 2021-04-03 NOTE — ED Notes (Signed)
Patients Carol [name], underwear, and pad were changed.

## 2021-04-04 ENCOUNTER — Inpatient Hospital Stay (HOSPITAL_COMMUNITY): Payer: BC Managed Care – PPO | Admitting: Anesthesiology

## 2021-04-04 ENCOUNTER — Inpatient Hospital Stay (HOSPITAL_COMMUNITY): Payer: BC Managed Care – PPO

## 2021-04-04 ENCOUNTER — Encounter (HOSPITAL_COMMUNITY): Payer: Self-pay | Admitting: Internal Medicine

## 2021-04-04 ENCOUNTER — Encounter (HOSPITAL_COMMUNITY)
Admission: EM | Disposition: A | Discharge status: Home Health | Payer: Self-pay | Source: Home / Self Care | Attending: Internal Medicine

## 2021-04-04 DIAGNOSIS — S82892A Other fracture of left lower leg, initial encounter for closed fracture: Secondary | ICD-10-CM | POA: Diagnosis not present

## 2021-04-04 HISTORY — PX: OPEN REDUCTION INTERNAL FIXATION (ORIF) TIBIA/FIBULA FRACTURE: SHX5992

## 2021-04-04 LAB — MRSA NEXT GEN BY PCR, NASAL: MRSA by PCR Next Gen: NOT DETECTED

## 2021-04-04 SURGERY — OPEN REDUCTION INTERNAL FIXATION (ORIF) TIBIA/FIBULA FRACTURE
Anesthesia: General | Site: Ankle | Laterality: Left

## 2021-04-04 MED ORDER — SODIUM CHLORIDE 0.9 % IR SOLN
Status: DC | PRN
  Administered 2021-04-04: 1000 mL

## 2021-04-04 MED ORDER — SENNOSIDES-DOCUSATE SODIUM 8.6-50 MG PO TABS: 1.0000 | ORAL_TABLET | Freq: Every day | ORAL | Status: DC

## 2021-04-04 MED ORDER — PROMETHAZINE HCL 25 MG/ML IJ SOLN: 6.2500 mg | INTRAMUSCULAR | Status: DC | PRN

## 2021-04-04 MED ORDER — MIDAZOLAM HCL 2 MG/2ML IJ SOLN
INTRAMUSCULAR | Status: AC
  Filled 2021-04-04: qty 2

## 2021-04-04 MED ORDER — MIDAZOLAM HCL 2 MG/2ML IJ SOLN
INTRAMUSCULAR | Status: DC | PRN
  Administered 2021-04-04: 2 mg via INTRAVENOUS

## 2021-04-04 MED ORDER — DEXAMETHASONE SODIUM PHOSPHATE 10 MG/ML IJ SOLN
INTRAMUSCULAR | Status: DC | PRN
  Administered 2021-04-04: 10 mg via INTRAVENOUS

## 2021-04-04 MED ORDER — ONDANSETRON HCL 4 MG/2ML IJ SOLN
INTRAMUSCULAR | Status: AC
  Filled 2021-04-04: qty 2

## 2021-04-04 MED ORDER — SUGAMMADEX SODIUM 200 MG/2ML IV SOLN
INTRAVENOUS | Status: DC | PRN
  Administered 2021-04-04: 200 mg via INTRAVENOUS

## 2021-04-04 MED ORDER — OXYCODONE HCL 5 MG/5ML PO SOLN: 5.0000 mg | Freq: Once | ORAL | Status: DC | PRN

## 2021-04-04 MED ORDER — PHENYLEPHRINE HCL-NACL 20-0.9 MG/250ML-% IV SOLN
INTRAVENOUS | Status: AC
  Filled 2021-04-04: qty 250

## 2021-04-04 MED ORDER — FENTANYL CITRATE (PF) 100 MCG/2ML IJ SOLN
INTRAMUSCULAR | Status: AC
  Filled 2021-04-04: qty 2

## 2021-04-04 MED ORDER — LIDOCAINE HCL (CARDIAC) PF 100 MG/5ML IV SOSY
PREFILLED_SYRINGE | INTRAVENOUS | Status: DC | PRN
  Administered 2021-04-04: 60 mg via INTRATRACHEAL

## 2021-04-04 MED ORDER — PROPOFOL 10 MG/ML IV BOLUS
INTRAVENOUS | Status: AC
  Filled 2021-04-04: qty 20

## 2021-04-04 MED ORDER — HYDROMORPHONE HCL 1 MG/ML IJ SOLN
INTRAMUSCULAR | Status: DC | PRN
  Administered 2021-04-04: .5 mg via INTRAVENOUS

## 2021-04-04 MED ORDER — CLINDAMYCIN PHOSPHATE 600 MG/50ML IV SOLN
600.0000 mg | Freq: Three times a day (TID) | INTRAVENOUS | Status: AC
  Administered 2021-04-04 – 2021-04-05 (×3): 600 mg via INTRAVENOUS
  Filled 2021-04-04 (×3): qty 50

## 2021-04-04 MED ORDER — CHLORHEXIDINE GLUCONATE 4 % EX LIQD: 60.0000 mL | Freq: Once | CUTANEOUS | Status: DC

## 2021-04-04 MED ORDER — PHENYLEPHRINE 40 MCG/ML (10ML) SYRINGE FOR IV PUSH (FOR BLOOD PRESSURE SUPPORT)
PREFILLED_SYRINGE | INTRAVENOUS | Status: AC
  Filled 2021-04-04: qty 10

## 2021-04-04 MED ORDER — ACETAMINOPHEN 10 MG/ML IV SOLN: 750.0000 mg | Freq: Once | INTRAVENOUS | Status: DC | PRN

## 2021-04-04 MED ORDER — OXYCODONE-ACETAMINOPHEN 7.5-325 MG PO TABS
1.0000 | ORAL_TABLET | ORAL | Status: DC | PRN
  Administered 2021-04-04 – 2021-04-07 (×12): 2 via ORAL
  Filled 2021-04-04 (×12): qty 2

## 2021-04-04 MED ORDER — SENNOSIDES-DOCUSATE SODIUM 8.6-50 MG PO TABS
2.0000 | ORAL_TABLET | Freq: Every day | ORAL | Status: DC
  Administered 2021-04-04 – 2021-04-06 (×3): 2 via ORAL
  Filled 2021-04-04 (×3): qty 2

## 2021-04-04 MED ORDER — POVIDONE-IODINE 10 % EX SWAB
2.0000 "application " | Freq: Once | CUTANEOUS | Status: AC
  Administered 2021-04-04: 2 via TOPICAL

## 2021-04-04 MED ORDER — ROCURONIUM BROMIDE 100 MG/10ML IV SOLN
INTRAVENOUS | Status: DC | PRN
  Administered 2021-04-04 (×4): 30 mg via INTRAVENOUS

## 2021-04-04 MED ORDER — LACTATED RINGERS IV SOLN: INTRAVENOUS | Status: DC

## 2021-04-04 MED ORDER — HYDROMORPHONE HCL 1 MG/ML IJ SOLN
INTRAMUSCULAR | Status: AC
  Administered 2021-04-05: 1 mg via INTRAVENOUS
  Filled 2021-04-04: qty 1

## 2021-04-04 MED ORDER — FENTANYL CITRATE (PF) 250 MCG/5ML IJ SOLN
INTRAMUSCULAR | Status: DC | PRN
  Administered 2021-04-04 (×5): 50 ug via INTRAVENOUS

## 2021-04-04 MED ORDER — CHLORHEXIDINE GLUCONATE 0.12 % MT SOLN
OROMUCOSAL | Status: AC
  Administered 2021-04-04: 15 mL via OROMUCOSAL
  Filled 2021-04-04: qty 15

## 2021-04-04 MED ORDER — KETAMINE HCL 50 MG/5ML IJ SOSY
PREFILLED_SYRINGE | INTRAMUSCULAR | Status: AC
  Filled 2021-04-04: qty 5

## 2021-04-04 MED ORDER — AMISULPRIDE (ANTIEMETIC) 5 MG/2ML IV SOLN: 10.0000 mg | Freq: Once | INTRAVENOUS | Status: DC | PRN

## 2021-04-04 MED ORDER — FENTANYL CITRATE (PF) 250 MCG/5ML IJ SOLN
INTRAMUSCULAR | Status: AC
  Filled 2021-04-04: qty 5

## 2021-04-04 MED ORDER — FENTANYL CITRATE (PF) 100 MCG/2ML IJ SOLN
25.0000 ug | INTRAMUSCULAR | Status: DC | PRN
  Administered 2021-04-04 (×2): 25 ug via INTRAVENOUS

## 2021-04-04 MED ORDER — ASPIRIN 325 MG PO TABS: 325.0000 mg | ORAL_TABLET | Freq: Every day | ORAL | Status: DC

## 2021-04-04 MED ORDER — ONDANSETRON HCL 4 MG/2ML IJ SOLN
INTRAMUSCULAR | Status: DC | PRN
  Administered 2021-04-04: 4 mg via INTRAVENOUS

## 2021-04-04 MED ORDER — VANCOMYCIN HCL 1000 MG IV SOLR
INTRAVENOUS | Status: AC
  Filled 2021-04-04: qty 20

## 2021-04-04 MED ORDER — LIDOCAINE 2% (20 MG/ML) 5 ML SYRINGE
INTRAMUSCULAR | Status: AC
  Filled 2021-04-04: qty 5

## 2021-04-04 MED ORDER — CLINDAMYCIN PHOSPHATE 900 MG/50ML IV SOLN
900.0000 mg | INTRAVENOUS | Status: AC
  Administered 2021-04-04: 900 mg via INTRAVENOUS
  Filled 2021-04-04: qty 50

## 2021-04-04 MED ORDER — PROPOFOL 10 MG/ML IV BOLUS
INTRAVENOUS | Status: DC | PRN
  Administered 2021-04-04: 100 mg via INTRAVENOUS

## 2021-04-04 MED ORDER — PHENYLEPHRINE HCL-NACL 20-0.9 MG/250ML-% IV SOLN
INTRAVENOUS | Status: DC | PRN
  Administered 2021-04-04: 25 ug/min via INTRAVENOUS

## 2021-04-04 MED ORDER — HYDROMORPHONE HCL 1 MG/ML IJ SOLN
1.0000 mg | INTRAMUSCULAR | Status: DC | PRN
  Administered 2021-04-04 – 2021-04-05 (×3): 1 mg via INTRAVENOUS
  Filled 2021-04-04 (×5): qty 1

## 2021-04-04 MED ORDER — DEXAMETHASONE SODIUM PHOSPHATE 10 MG/ML IJ SOLN
INTRAMUSCULAR | Status: AC
  Filled 2021-04-04: qty 1

## 2021-04-04 MED ORDER — PHENYLEPHRINE HCL (PRESSORS) 10 MG/ML IV SOLN
INTRAVENOUS | Status: DC | PRN
  Administered 2021-04-04: 40 ug via INTRAVENOUS
  Administered 2021-04-04 (×3): 80 ug via INTRAVENOUS

## 2021-04-04 MED ORDER — ROCURONIUM BROMIDE 10 MG/ML (PF) SYRINGE
PREFILLED_SYRINGE | INTRAVENOUS | Status: AC
  Filled 2021-04-04: qty 10

## 2021-04-04 MED ORDER — CHLORHEXIDINE GLUCONATE 0.12 % MT SOLN: 15.0000 mL | Freq: Once | OROMUCOSAL | Status: AC

## 2021-04-04 MED ORDER — VANCOMYCIN HCL 1000 MG IV SOLR
INTRAVENOUS | Status: DC | PRN
  Administered 2021-04-04: 1000 mg

## 2021-04-04 MED ORDER — OXYCODONE HCL 5 MG PO TABS: 5.0000 mg | ORAL_TABLET | Freq: Once | ORAL | Status: DC | PRN

## 2021-04-04 SURGICAL SUPPLY — 80 items
ALCOHOL 70% 16 OZ (MISCELLANEOUS) ×2 IMPLANT
APL PRP STRL LF DISP 70% ISPRP (MISCELLANEOUS) ×2
BAG COUNTER SPONGE SURGICOUNT (BAG) ×2 IMPLANT
BAG SPNG CNTER NS LX DISP (BAG) ×1
BANDAGE ESMARK 6X9 LF (GAUZE/BANDAGES/DRESSINGS) ×1 IMPLANT
BIT DRILL 2.5X2.75 QC CALB (BIT) ×1 IMPLANT
BLADE SURG 15 STRL LF DISP TIS (BLADE) ×1 IMPLANT
BLADE SURG 15 STRL SS (BLADE) ×2
BNDG CMPR 9X6 STRL LF SNTH (GAUZE/BANDAGES/DRESSINGS) ×1
BNDG CMPR MED 10X6 ELC LF (GAUZE/BANDAGES/DRESSINGS) ×1
BNDG COHESIVE 4X5 TAN STRL (GAUZE/BANDAGES/DRESSINGS) ×2 IMPLANT
BNDG COHESIVE 6X5 TAN STRL LF (GAUZE/BANDAGES/DRESSINGS) ×1 IMPLANT
BNDG ELASTIC 4X5.8 VLCR STR LF (GAUZE/BANDAGES/DRESSINGS) ×1 IMPLANT
BNDG ELASTIC 6X10 VLCR STRL LF (GAUZE/BANDAGES/DRESSINGS) ×1 IMPLANT
BNDG ESMARK 6X9 LF (GAUZE/BANDAGES/DRESSINGS) ×2
BNDG GAUZE ELAST 4 BULKY (GAUZE/BANDAGES/DRESSINGS) ×1 IMPLANT
CANISTER SUCT 3000ML PPV (MISCELLANEOUS) ×2 IMPLANT
CHLORAPREP W/TINT 26 (MISCELLANEOUS) ×4 IMPLANT
COVER SURGICAL LIGHT HANDLE (MISCELLANEOUS) ×2 IMPLANT
CUFF TOURN SGL QUICK 24 (TOURNIQUET CUFF) ×2
CUFF TOURN SGL QUICK 34 (TOURNIQUET CUFF)
CUFF TOURN SGL QUICK 42 (TOURNIQUET CUFF) IMPLANT
CUFF TRNQT CYL 24X4X40X1 (TOURNIQUET CUFF) IMPLANT
CUFF TRNQT CYL 34X4.125X (TOURNIQUET CUFF) ×1 IMPLANT
DRAPE OEC MINIVIEW 54X84 (DRAPES) ×1 IMPLANT
DRAPE U-SHAPE 47X51 STRL (DRAPES) ×2 IMPLANT
DRSG MEPITEL 4X7.2 (GAUZE/BANDAGES/DRESSINGS) ×1 IMPLANT
DRSG PAD ABDOMINAL 8X10 ST (GAUZE/BANDAGES/DRESSINGS) ×4 IMPLANT
ELECT REM PT RETURN 9FT ADLT (ELECTROSURGICAL) ×2
ELECTRODE REM PT RTRN 9FT ADLT (ELECTROSURGICAL) ×1 IMPLANT
GAUZE SPONGE 4X4 12PLY STRL (GAUZE/BANDAGES/DRESSINGS) ×1 IMPLANT
GAUZE XEROFORM 5X9 LF (GAUZE/BANDAGES/DRESSINGS) ×1 IMPLANT
GLOVE SRG 8 PF TXTR STRL LF DI (GLOVE) ×1 IMPLANT
GLOVE SURG ENC MOIS LTX SZ8 (GLOVE) ×1 IMPLANT
GLOVE SURG LTX SZ8 (GLOVE) ×1 IMPLANT
GLOVE SURG UNDER POLY LF SZ8 (GLOVE)
GOWN STRL REUS W/ TWL LRG LVL3 (GOWN DISPOSABLE) ×1 IMPLANT
GOWN STRL REUS W/ TWL XL LVL3 (GOWN DISPOSABLE) ×2 IMPLANT
GOWN STRL REUS W/TWL LRG LVL3 (GOWN DISPOSABLE) ×2
GOWN STRL REUS W/TWL XL LVL3 (GOWN DISPOSABLE) ×4
K-WIRE ACE 1.6X6 (WIRE) ×4
KIT BASIN OR (CUSTOM PROCEDURE TRAY) ×2 IMPLANT
KIT TURNOVER KIT B (KITS) ×2 IMPLANT
KWIRE ACE 1.6X6 (WIRE) IMPLANT
NS IRRIG 1000ML POUR BTL (IV SOLUTION) ×2 IMPLANT
PACK ORTHO EXTREMITY (CUSTOM PROCEDURE TRAY) ×2 IMPLANT
PAD ABD 8X10 STRL (GAUZE/BANDAGES/DRESSINGS) ×1 IMPLANT
PAD ARMBOARD 7.5X6 YLW CONV (MISCELLANEOUS) ×4 IMPLANT
PAD CAST 4YDX4 CTTN HI CHSV (CAST SUPPLIES) ×1 IMPLANT
PADDING CAST COTTON 4X4 STRL (CAST SUPPLIES) ×2
PLATE 6H 142 LT MED DIST TIB (Plate) ×1 IMPLANT
PLATE LOCK 7H 92 BILAT FIB (Plate) ×1 IMPLANT
PUTTY DBM STAGRAFT PLUS 2CC (Putty) ×1 IMPLANT
SCREW CORTICAL 3.5MM  44MM (Screw) ×2 IMPLANT
SCREW CORTICAL 3.5MM 22MM (Screw) ×2 IMPLANT
SCREW CORTICAL 3.5MM 24MM (Screw) ×1 IMPLANT
SCREW CORTICAL 3.5MM 44MM (Screw) IMPLANT
SCREW LOCK CORT STAR 3.5X14 (Screw) ×1 IMPLANT
SCREW LOCK CORT STAR 3.5X16 (Screw) ×1 IMPLANT
SCREW LOCK CORT STAR 3.5X24 (Screw) ×2 IMPLANT
SCREW LOCK CORT STAR 3.5X26 (Screw) ×1 IMPLANT
SCREW LOCK CORT STAR 3.5X28 (Screw) ×2 IMPLANT
SCREW LOCK CORT STAR 3.5X34 (Screw) ×1 IMPLANT
SCREW LOCK CORT STAR 3.5X36 (Screw) ×1 IMPLANT
SCREW LP 3.5X44 (Screw) ×1 IMPLANT
SCREW NON LOCKING LP 3.5 14MM (Screw) ×4 IMPLANT
SPLINT PLASTER CAST XFAST 5X30 (CAST SUPPLIES) IMPLANT
SPLINT PLASTER XFAST SET 5X30 (CAST SUPPLIES) ×1
SPONGE T-LAP 18X18 ~~LOC~~+RFID (SPONGE) ×2 IMPLANT
SUCTION FRAZIER HANDLE 10FR (MISCELLANEOUS) ×2
SUCTION TUBE FRAZIER 10FR DISP (MISCELLANEOUS) ×1 IMPLANT
SUT ETHILON 3 0 PS 1 (SUTURE) ×5 IMPLANT
SUT MNCRL AB 3-0 PS2 18 (SUTURE) IMPLANT
SUT VIC AB 2-0 CT1 27 (SUTURE) ×4
SUT VIC AB 2-0 CT1 TAPERPNT 27 (SUTURE) ×2 IMPLANT
SUT VIC AB 3-0 SH 27 (SUTURE) ×4
SUT VIC AB 3-0 SH 27X BRD (SUTURE) IMPLANT
TOWEL GREEN STERILE (TOWEL DISPOSABLE) ×2 IMPLANT
TOWEL GREEN STERILE FF (TOWEL DISPOSABLE) ×2 IMPLANT
TUBE CONNECTING 12X1/4 (SUCTIONS) ×2 IMPLANT

## 2021-04-04 NOTE — Progress Notes (Signed)
PROGRESS NOTE  Carol [name] TML:[id number] DOB: Sep 28, 1970 DOA: 04/02/2021 PCP: Leighton Ruff, MD  HPI/Recap of past 24 hours: Admit date:04/02/2021 Chief compliant: left ankle pain 50 year old female  with medical history significant of GERD, hyperlipidemia, chronic anxiety/depression who presented to the ED after she fell off of the monkey bars, experienced immediate pain and deformity of the left ankle and was unable to ambulate.  In the emergency room imaging studies demonstrated a distal tib-fib fracture.  She was given conscious sedation and a gentle manipulation maneuver was applied by the emergency room staff and a splint was applied.  Orthopedic consultation was requested. Initially was felt as though the patient would be able to be discharged however due to increased pain and the fact that she lives on the second story she felt as though would be too difficult to go home--ortho recommended to admit to medicine and consult Foot/ankle surgery for possible ORIF as inpatient.  03/05/2021: Patient was seen and examined at her bedside prior to surgery.  Her daughter and husband were present in the room.  She was starting to have pain in her left ankle and requesting her pain medication.  Assessment/Plan: Principal Problem:   Closed left ankle fracture Active Problems:   Anxiety   Gastroesophageal reflux disease   Grover's disease   Hypercholesterolemia   Seborrheic keratosis   Restless legs syndrome  #1 Left ankle fracture post mechanical fall: Involves the distal tibiofibular region.   Pain management in place, bowel regimen for opioid-induced constipation Gentle IV fluid hydration Plan for left ankle ORIF 04/04/2021.   #2 GERD: Continue with PPIs.   #3 Anxiety disorder: Resume home regimen when able to take p.o.  IV meds as needed for now.   #4 Seborrheic keratosis: Resume Lamisil when able to take p.o.   #5 Restless leg syndrome: Does not appear to be on any home regimen.   Continue to monitor   #6 Hyperlipidemia: Not on a statin.   DVT prophylaxis: Defer DVT prophylaxis to orthopedic surgery. Code Status: Full code Family / Patient Communication: Husband and daughter at bedside.    Disposition Plan:   Status is: Inpatient   Remains inpatient appropriate because:Inpatient level of care appropriate due to severity of illness   Dispo: The patient is from: Home              Anticipated d/c is to: Home possibly with home health services              Patient currently is not medically stable to d/c.              Difficult to place patient No     Status is: Inpatient    Dispo:  Patient From: Home  Planned Disposition: Home with Health Care Svc  Medically stable for discharge: No          Objective: Vitals:   04/03/21 2230 04/03/21 2313 04/04/21 0759 04/04/21 0858  BP:  99/73 103/65 91/60  Pulse: 90 92 98 (!) 103  Resp: 16 18 14 18   Temp:  98.7 F (37.1 C) 98.8 F (37.1 C) 98.3 F (36.8 C)  TempSrc:  Oral Oral Oral  SpO2: 99% 100% 95% 94%  Weight:      Height:        Intake/Output Summary (Last 24 hours) at 04/04/2021 1326 Last data filed at 04/04/2021 1040 Gross per 24 hour  Intake 100 ml  Output --  Net 100 ml   [employer]  04/02/21 1544 04/03/21 1659  Weight: 49.4 kg 49.4 kg    Exam:  General: 50 y.o. year-old female well developed well nourished in no acute distress.  Alert and oriented x3. Cardiovascular: Regular rate and rhythm with no rubs or gallops.  No thyromegaly or JVD noted.   Respiratory: Clear to auscultation with no wheezes or rales. Good inspiratory effort. Abdomen: Soft nontender nondistended with normal bowel sounds x4 quadrants. Musculoskeletal: Left lower extremity splint in place.   Skin: No ulcerative lesions noted or rashes.  No wounds noted. Psychiatry: Mood is appropriate for condition and setting   Data Reviewed: CBC: Recent Labs  Lab 04/02/21 1644 04/02/21 2253 04/03/21 0449  WBC 6.3  8.1 7.5  NEUTROABS 4.6  --   --   HGB 11.1* 11.2* 11.4*  HCT 33.2* 33.7* 34.8*  MCV 90.5 91.3 92.3  PLT 154 161 101   Basic Metabolic Panel: Recent Labs  Lab 04/02/21 1644 04/02/21 2253 04/03/21 0449  NA 139  --  138  K 3.9  --  4.5  CL 109  --  106  CO2 23  --  26  GLUCOSE 105*  --  126*  BUN 25*  --  19  CREATININE 0.85 0.91 0.83  CALCIUM 8.3*  --  8.7*   GFR: Estimated Creatinine Clearance: 60.5 mL/min (by C-G formula based on SCr of 0.83 mg/dL). Liver Function Tests: Recent Labs  Lab 04/03/21 0449  AST 28  ALT 19  ALKPHOS 47  BILITOT 0.6  PROT 6.5  ALBUMIN 4.0   No results for input(s): LIPASE, AMYLASE in the last 168 hours. No results for input(s): AMMONIA in the last 168 hours. Coagulation Profile: Recent Labs  Lab 04/02/21 1644  INR 1.2   Cardiac Enzymes: No results for input(s): CKTOTAL, CKMB, CKMBINDEX, TROPONINI in the last 168 hours. BNP (last 3 results) No results for input(s): PROBNP in the last 8760 hours. HbA1C: No results for input(s): HGBA1C in the last 72 hours. CBG: No results for input(s): GLUCAP in the last 168 hours. Lipid Profile: No results for input(s): CHOL, HDL, LDLCALC, TRIG, CHOLHDL, LDLDIRECT in the last 72 hours. Thyroid Function Tests: No results for input(s): TSH, T4TOTAL, FREET4, T3FREE, THYROIDAB in the last 72 hours. Anemia Panel: No results for input(s): VITAMINB12, FOLATE, FERRITIN, TIBC, IRON, RETICCTPCT in the last 72 hours. Urine analysis: No results found for: COLORURINE, APPEARANCEUR, LABSPEC, PHURINE, GLUCOSEU, HGBUR, BILIRUBINUR, KETONESUR, PROTEINUR, UROBILINOGEN, NITRITE, LEUKOCYTESUR Sepsis Labs: @LABRCNTIP (procalcitonin:4,lacticidven:4)  ) Recent Results (from the past 240 hour(s))  Resp Panel by RT-PCR (Flu A&B, Covid) Nasopharyngeal Swab     Status: None   Collection Time: 04/02/21  5:14 PM   Specimen: Nasopharyngeal Swab; Nasopharyngeal(NP) swabs in vial transport medium  Result Value Ref Range  Status   SARS Coronavirus 2 by RT PCR NEGATIVE NEGATIVE Final    Comment: (NOTE) SARS-CoV-2 target nucleic acids are NOT DETECTED.  The SARS-CoV-2 RNA is generally detectable in upper respiratory specimens during the acute phase of infection. The lowest concentration of SARS-CoV-2 viral copies this assay can detect is 138 copies/mL. A negative result does not preclude SARS-Cov-2 infection and should not be used as the sole basis for treatment or other patient management decisions. A negative result may occur with  improper specimen collection/handling, submission of specimen other than nasopharyngeal swab, presence of viral mutation(s) within the areas targeted by this assay, and inadequate number of viral copies(<138 copies/mL). A negative result must be combined with clinical observations, patient history, and epidemiological  information. The expected result is Negative.  Fact Sheet for Patients:  EntrepreneurPulse.com.au  Fact Sheet for Healthcare Providers:  IncredibleEmployment.be  This test is no t yet approved or cleared by the [place] FDA and  has been authorized for detection and/or diagnosis of SARS-CoV-2 by FDA under an Emergency Use Authorization (EUA). This EUA will remain  in effect (meaning this test can be used) for the duration of the COVID-19 declaration under Section 564(b)(1) of the Act, 21 U.S.C.section 360bbb-3(b)(1), unless the authorization is terminated  or revoked sooner.       Influenza A by PCR NEGATIVE NEGATIVE Final   Influenza B by PCR NEGATIVE NEGATIVE Final    Comment: (NOTE) The Xpert Xpress SARS-CoV-2/FLU/RSV plus assay is intended as an aid in the diagnosis of influenza from Nasopharyngeal swab specimens and should not be used as a sole basis for treatment. Nasal washings and aspirates are unacceptable for Xpert Xpress SARS-CoV-2/FLU/RSV testing.  Fact Sheet for  Patients: EntrepreneurPulse.com.au  Fact Sheet for Healthcare Providers: IncredibleEmployment.be  This test is not yet approved or cleared by the [place] FDA and has been authorized for detection and/or diagnosis of SARS-CoV-2 by FDA under an Emergency Use Authorization (EUA). This EUA will remain in effect (meaning this test can be used) for the duration of the COVID-19 declaration under Section 564(b)(1) of the Act, 21 U.S.C. section 360bbb-3(b)(1), unless the authorization is terminated or revoked.  Performed at [facility], [place] [address], [place], [place] [address]   MRSA Next Gen by PCR, Nasal     Status: None   Collection Time: 04/04/21  8:26 AM   Specimen: Nasal Mucosa; Nasal Swab  Result Value Ref Range Status   MRSA by PCR Next Gen NOT DETECTED NOT DETECTED Final    Comment: (NOTE) The GeneXpert MRSA Assay (FDA approved for NASAL specimens only), is one component of a comprehensive MRSA colonization surveillance program. It is not intended to diagnose MRSA infection nor to guide or monitor treatment for MRSA infections. Test performance is not FDA approved in patients less than 51 years old. Performed at [facility], [place] [address] [place],  [address]       Studies: CT ANKLE LEFT WO CONTRAST  Result Date: 04/03/2021 CLINICAL DATA:  Distal tibial and fibular fractures. EXAM: CT OF THE LEFT ANKLE WITHOUT CONTRAST TECHNIQUE: Multidetector CT imaging of the left ankle was performed according to the standard protocol. Multiplanar CT image reconstructions were also generated. COMPARISON:  None. FINDINGS: Bones/Joint/Cartilage Comminuted transverse fracture of the distal tibial metaphysis with 7 mm of lateral displacement and 4 mm of anterior displacement. Mildly comminuted transverse distal fibular metaphysis fracture with 8 mm of lateral displacement and 5 mm of anterior displacement. No other  fracture or dislocation. No aggressive osseous lesion. Ankle mortise is intact. No joint effusion. Ligaments Ligaments are suboptimally evaluated by CT. Muscles and Tendons Flexor, extensor, peroneal and Achilles tendons are grossly intact. Plantar fascia is grossly intact. Soft tissue No fluid collection or hematoma. No soft tissue mass. Soft tissue swelling over the medial and lateral aspect of the ankle. IMPRESSION: 1. Comminuted transverse fracture of the distal tibial metaphysis with 7 mm of lateral displacement and 4 mm of anterior displacement. 2. Mildly comminuted transverse distal fibular metaphysis fracture with 8 mm of lateral displacement and 5 mm of anterior displacement. Electronically Signed   By: Kathreen Devoid M.D.   On: 04/03/2021 18:48    Scheduled Meds:  chlorhexidine  60  mL Topical Once   [MAR Hold] citalopram  20 mg Oral QODAY   [MAR Hold] multivitamin with minerals  1 tablet Oral Daily    Continuous Infusions:  lactated ringers 100 mL/hr at 04/03/21 2052   lactated ringers 10 mL/hr at 04/04/21 0905   [MAR Hold] methocarbamol (ROBAXIN) IV Stopped (04/03/21 2050)     LOS: 2 days     Kayleen Memos, MD [facility] Pager [phone]  If 7PM-7AM, please contact night-coverage [email or web address] Password [email or web address] 04/04/2021, 1:26 PM

## 2021-04-04 NOTE — Plan of Care (Signed)

## 2021-04-04 NOTE — Anesthesia Postprocedure Evaluation (Signed)
Anesthesia Post Note  Patient: Carol [name]  Procedure(s) Performed: OPEN REDUCTION INTERNAL FIXATION (ORIF) TIBIA/FIBULA FRACTURE (Left: Ankle)     Patient location during evaluation: PACU Anesthesia Type: General Level of consciousness: awake Pain management: pain level controlled Vital Signs Assessment: post-procedure vital signs reviewed and stable Respiratory status: spontaneous breathing, nonlabored ventilation, respiratory function stable and patient connected to nasal cannula oxygen Cardiovascular status: blood pressure returned to baseline and stable Postop Assessment: no apparent nausea or vomiting Anesthetic complications: no   No notable events documented.  Last Vitals:  Vitals:   04/04/21 1657 04/04/21 2037  BP: 119/76 110/69  Pulse: (!) 104 (!) 104  Resp: 16   Temp: 37.3 C 37.1 C  SpO2: 94% 98%    Last Pain:  Vitals:   04/04/21 2037  TempSrc: Oral  PainSc: 10-Worst pain ever                 Shaima Sardinas P Asaad Gulley

## 2021-04-04 NOTE — Anesthesia Preprocedure Evaluation (Signed)
Anesthesia Evaluation  Patient identified by MRN, date of birth, ID band Patient awake    Reviewed: Allergy & Precautions, NPO status , Patient's Chart, lab work & pertinent test results  Airway Mallampati: II  TM Distance: >3 FB Neck ROM: Full    Dental no notable dental hx.    Pulmonary neg pulmonary ROS,    Pulmonary exam normal breath sounds clear to auscultation       Cardiovascular negative cardio ROS Normal cardiovascular exam Rhythm:Regular Rate:Normal  ECHO: 1. Left ventricular ejection fraction, by estimation, is 55 to 60%. The left ventricle has normal function. The left ventricle has no regional wall motion abnormalities. Left ventricular diastolic parameters were normal. 2. Right ventricular systolic function is normal. The right ventricular size is normal. There is normal pulmonary artery systolic pressure. 3. The mitral valve is normal in structure. No evidence of mitral valve regurgitation. 4. The aortic valve is normal in structure. Aortic valve regurgitation is not visualized. 5. The inferior vena cava is normal in size with greater than 50% respiratory variability, suggesting right atrial pressure of 3 mmHg.    Neuro/Psych Anxiety negative neurological ROS     GI/Hepatic negative GI ROS, Neg liver ROS,   Endo/Other  negative endocrine ROS  Renal/GU negative Renal ROS     Musculoskeletal negative musculoskeletal ROS (+)   Abdominal   Peds  Hematology  (+) anemia , HLD   Anesthesia Other Findings  Left ankle fracture  Reproductive/Obstetrics                             Anesthesia Physical Anesthesia Plan  ASA: 2  Anesthesia Plan: General   Post-op Pain Management:    Induction: Intravenous  PONV Risk Score and Plan: 3 and Ondansetron, Dexamethasone, Midazolam and Treatment may vary due to age or medical condition  Airway Management Planned: Oral ETT  Additional  Equipment:   Intra-op Plan:   Post-operative Plan: Extubation in OR  Informed Consent: I have reviewed the patients History and Physical, chart, labs and discussed the procedure including the risks, benefits and alternatives for the proposed anesthesia with the patient or authorized representative who has indicated his/her understanding and acceptance.     Dental advisory given  Plan Discussed with: CRNA  Anesthesia Plan Comments: (Potential post-op regional anesthesia discussed, if surgeon performs ORIF)        Anesthesia Quick Evaluation

## 2021-04-04 NOTE — Anesthesia Procedure Notes (Signed)
Procedure Name: Intubation Date/Time: 04/04/2021 10:35 AM Performed by: Amadeo Garnet, CRNA Pre-anesthesia Checklist: Patient identified, Emergency Drugs available, Suction available and Patient being monitored Patient Re-evaluated:Patient Re-evaluated prior to induction Oxygen Delivery Method: Circle system utilized Preoxygenation: Pre-oxygenation with 100% oxygen Induction Type: IV induction Ventilation: Mask ventilation without difficulty Laryngoscope Size: Mac and 4 Grade View: Grade I Tube type: Oral Number of attempts: 2 Airway Equipment and Method: Stylet and Oral airway Placement Confirmation: ETT inserted through vocal cords under direct vision, positive ETCO2 and breath sounds checked- equal and bilateral Secured at: 22 cm Tube secured with: Tape Dental Injury: Teeth and Oropharynx as per pre-operative assessment

## 2021-04-04 NOTE — H&P (Signed)
H&P documentation:  -History and Physical Reviewed  -Patient has been re-examined  -The risks and benefits of the alternative treatment options have been discussed in detail.  The patient wishes to proceed with surgery and specifically understands risks of bleeding, infection, nerve damage, blood clots, need for additional surgery, amputation and death.  -No change in the plan of care  Armond Hang, MD Orthopaedic Surgery

## 2021-04-04 NOTE — Transfer of Care (Signed)
Immediate Anesthesia Transfer of Care Note  Patient: Carol [name]  Procedure(s) Performed: OPEN REDUCTION INTERNAL FIXATION (ORIF) TIBIA/FIBULA FRACTURE (Left: Ankle)  Patient Location: PACU  Anesthesia Type:General  Level of Consciousness: awake, alert  and oriented  Airway & Oxygen Therapy: Patient Spontanous Breathing and Patient connected to nasal cannula oxygen  Post-op Assessment: Report given to RN, Post -op Vital signs reviewed and stable and Patient moving all extremities  Post vital signs: Reviewed and stable  Last Vitals:  Vitals Value Taken Time  BP 133/69 04/04/21 1441  Temp    Pulse 117 04/04/21 1443  Resp 18 04/04/21 1443  SpO2 87 % 04/04/21 1443  Vitals shown include unvalidated device data.  Last Pain:  Vitals:   04/04/21 0858  TempSrc: Oral  PainSc: 0-No pain      Patients Stated Pain Goal: 5 (28/83/37 4451)  Complications: No notable events documented.

## 2021-04-04 NOTE — Brief Op Note (Signed)
04/04/2021  3:02 PM  PATIENT:  Carol [name]  50 y.o. female  PRE-OPERATIVE DIAGNOSIS:  Left ankle fx  POST-OPERATIVE DIAGNOSIS:  Left ankle fx  PROCEDURE:  Procedure(s): OPEN REDUCTION INTERNAL FIXATION (ORIF) TIBIA/FIBULA FRACTURE (Left)  Left distal tibia open reduction internal fixation Left distal fibula open reduction internal fixation Left short leg splint application  SURGEON:  Surgeon(s) and Role:    * Armond Hang, MD - Primary  PHYSICIAN ASSISTANT: None  ASSISTANTS: none   ANESTHESIA:   general  EBL:  50 mL   BLOOD ADMINISTERED:none  DRAINS: none   LOCAL MEDICATIONS USED:  NONE  SPECIMEN:  No Specimen  DISPOSITION OF SPECIMEN:  N/A  COUNTS:  YES  TOURNIQUET:  Thigh (120 min)  DICTATION: .Note written in EPIC  PLAN OF CARE:  Return to regular nursing floor under primary Medicine Hospitalist team  PATIENT DISPOSITION:  PACU - hemodynamically stable.   Delay start of Pharmacological VTE agent (>24hrs) due to surgical blood loss or risk of bleeding: yes  PLAN:  -IV clinda x 24 hrs -Strict NWB LLE  -maintain short leg splint -DVT prophylaxis per primary team (surgical team recommends Aspirin 325 once daily x 30 days)  Armond Hang, MD Orthopaedic Surgery [facility] [phone]

## 2021-04-04 NOTE — Op Note (Signed)
04/04/2021  7:04 PM   PATIENT:  Carol [name]  50 y.o. female  MRN: [id number]   PRE-OPERATIVE DIAGNOSIS:   Fracture of lower end of left tibia, closed fracture Fracture of lower end of left fibula, closed fracture   POST-OPERATIVE DIAGNOSIS:   Fracture of lower end of left tibia, closed fracture Fracture of lower end of left fibula, closed fracture   PROCEDURE: Open treatment of left tibial shaft fracture (with fibular fracture), with plate/screws, without cerclage Open treatment of left distal fibula fracture, with plate/screws, without cerclage   SURGEON:  Armond Hang, MD   ASSISTANT: None   ANESTHESIA:   General   EBL:  50 ml    TOURNIQUET:  Thigh, 308 min   COMPLICATIONS:  None apparent   DISPOSITION:  Extubated, awake and stable to recovery.   INDICATION FOR PROCEDURE: Carol [name] , a 50 y.o. female presented to [facility] ER on 04/02/21.  Patient reported she was playing on monkey bars, slipped, and fell and heard a pop in the region. Exam and imaging demonstrated comminuted closed left distal tibia and fibula fractures. Closed reduction and splinting was carried out by the emergency department team and Orthopaedics was consulted. The injury was further evaluated with CT left ankle on 04/03/21 for preoperative planning. The patient was transferred to [facility] on 04/03/21 and consented for surgery on 04/04/21.  We discussed the diagnosis, alternative treatment options, risks and benefits of the above surgical intervention, as well as alternative non-operative treatments. All questions/concerns were addressed and the patient/family demonstrated appropriate understanding of the diagnosis, the procedure, the postoperative course, and overall prognosis. The patient wished to proceed with surgical intervention and signed an informed surgical consent as such, in each others presence prior to surgery.   PROCEDURE IN DETAIL: After preoperative consent was  obtained and the correct operative site was identified, the patient was brought to the operating room supine on stretcher and transferred onto operating table.  General anesthesia was induced. Preoperative antibiotics were administered. Surgical timeout was taken. The patient was then positioned supine with an ipsilateral hip bump. The short leg splint was removed and the skin noted to be intact. The left lower extremity was prepped and draped in standard sterile fashion with a tourniquet around the thigh. The extremity was exsanguinated and the tourniquet was inflated to 250 mmHg.  The left lower leg fractures were evaluated using intraoperative fluoroscopy. A medially based incision was made over the distal tibia. The posterior tibialis and tibialis anterior tendons were protected along with the great saphenous vein and its branches, which were safely retracted away. Dissection was carried down to the distal tibia fracture and the edges of the fracture were defined. The distal tibia and fibula fractures were open reduced under direct visualization and confirmed under fluoroscopy. Kirschner wires were placed percutaneously to provisionally maintain fracture reduction.  A medial distal tibia plate was selected of appropriate length. This was provisionally fixed to the distal tibia and position verified under fluoroscopy. A distal cortical screw was applied to bring the plate in close proximity with the tibia cortex. The distal most screw was implanted in a lag by technique fashion and fluoroscopy was performed to ensure no violation of the tibiotalar articulation. A tibial shaft cortical screw was implanted in compression mode to provide greater rigidity to the fracture fixation. More cortical screws were implanted proximally using percutaneous incisions with excellent purchase and rigidity. The distal cluster of locking screws were then implanted.   Attention was  then turned to the fibula. A lateral incision  was made directly over the fibula and dissection carried down to the level of the fracture. The fracture site was cleared and the edges were debrided for reduction. Significant comminution and bone loss was noted at the fibula fracture site. A locking plate was applied after pulling the fibula out to anatomic length. A series of locking cancellous and non-locking cortical screws were serially implanted to secure the fibula plate.   Following this, radiographs were taken to ensure maintained reduction of tibial and fibular fractures as well as appropriate position of all hardware. An external rotation stress view was obtained showing maintained ankle mortise.  The surgical sites were thoroughly irrigated. The tourniquet was deflated and hemostasis achieved. Demineralized bone matrix putty (2 cc) was placed in the fibula fracture site due to significant loss of native bone. Vancomycin powder was applied to both the medial and lateral incisions.  The deep layers were closed using 2-0 vicryl and the subcutaneous tissues closed using 3-0 vicryl. The skin was closed without tension in all incisions using 3-0 nylon suture.   The leg was cleaned with saline and sterile xeroform dressings with gauze were applied. A well padded bulky short leg splint was applied. The patient was awakened from anesthesia and transported to the recovery room in stable condition.    FOLLOW UP PLAN: -transfer to PACU, then RNF under Hospital Medicine -IV clindamycin 600 mg x 24 hrs -strict NWB LLE, maximum elevation -maintain short leg splint LLE until follow up -no nerve block, pain meds per primary team -DVT Ppx: Aspirin 325 mg once daily x 30 days -physical therapy to assist with crutch training -discharge home per primary team -sutures out in 2-3 weeks with exchange of short leg splint to short leg cast in outpatient office   RADIOGRAPHS: AP, lateral, oblique radiographs of the left ankle were obtained intraoperatively.   These showed interval reduction and fixation of the distal tibia and fibula fractures. All hardware is appropriately positioned and of the appropriate lengths.  No other acute injuries are noted.    Armond Hang Orthopaedic Surgery [facility]

## 2021-04-05 DIAGNOSIS — S82892A Other fracture of left lower leg, initial encounter for closed fracture: Secondary | ICD-10-CM | POA: Diagnosis not present

## 2021-04-05 LAB — CBC
HCT: 26.1 % — ABNORMAL LOW (ref 36.0–46.0)
Hemoglobin: 8.7 g/dL — ABNORMAL LOW (ref 12.0–15.0)
MCH: 29.9 pg (ref 26.0–34.0)
MCHC: 33.3 g/dL (ref 30.0–36.0)
MCV: 89.7 fL (ref 80.0–100.0)
Platelets: 129 10*3/uL — ABNORMAL LOW (ref 150–400)
RBC: 2.91 MIL/uL — ABNORMAL LOW (ref 3.87–5.11)
RDW: 11.9 % (ref 11.5–15.5)
WBC: 8.8 10*3/uL (ref 4.0–10.5)
nRBC: 0 % (ref 0.0–0.2)

## 2021-04-05 LAB — VITAMIN D 25 HYDROXY (VIT D DEFICIENCY, FRACTURES): Vit D, 25-Hydroxy: 54.28 ng/mL (ref 30–100)

## 2021-04-05 LAB — BASIC METABOLIC PANEL
Anion gap: 6 (ref 5–15)
BUN: 11 mg/dL (ref 6–20)
CO2: 28 mmol/L (ref 22–32)
Calcium: 8.1 mg/dL — ABNORMAL LOW (ref 8.9–10.3)
Chloride: 103 mmol/L (ref 98–111)
Creatinine, Ser: 0.93 mg/dL (ref 0.44–1.00)
GFR, Estimated: >60 mL/min (ref >60)
Glucose, Bld: 112 mg/dL — ABNORMAL HIGH (ref 70–99)
Potassium: 4.1 mmol/L (ref 3.5–5.1)
Sodium: 137 mmol/L (ref 135–145)

## 2021-04-05 LAB — MAGNESIUM: Magnesium: 1.7 mg/dL (ref 1.7–2.4)

## 2021-04-05 LAB — PHOSPHORUS: Phosphorus: 3.4 mg/dL (ref 2.5–4.6)

## 2021-04-05 MED ORDER — METHOCARBAMOL 500 MG PO TABS
500.0000 mg | ORAL_TABLET | Freq: Four times a day (QID) | ORAL | Status: DC | PRN
  Administered 2021-04-05 – 2021-04-08 (×4): 500 mg via ORAL
  Filled 2021-04-05 (×4): qty 1

## 2021-04-05 MED ORDER — DIPHENHYDRAMINE HCL 50 MG/ML IJ SOLN
12.5000 mg | INTRAMUSCULAR | Status: AC
  Administered 2021-04-05: 12.5 mg via INTRAVENOUS
  Filled 2021-04-05: qty 1

## 2021-04-05 MED ORDER — ASPIRIN 325 MG PO TABS
325.0000 mg | ORAL_TABLET | Freq: Every day | ORAL | Status: DC
  Administered 2021-04-05 – 2021-04-08 (×4): 325 mg via ORAL
  Filled 2021-04-05 (×4): qty 1

## 2021-04-05 MED ORDER — HYDROMORPHONE HCL 1 MG/ML IJ SOLN
1.0000 mg | Freq: Four times a day (QID) | INTRAMUSCULAR | Status: DC | PRN
  Administered 2021-04-05 – 2021-04-07 (×2): 1 mg via INTRAVENOUS
  Filled 2021-04-05 (×2): qty 1

## 2021-04-05 MED ORDER — DIPHENHYDRAMINE HCL 25 MG PO CAPS
25.0000 mg | ORAL_CAPSULE | Freq: Four times a day (QID) | ORAL | Status: DC | PRN
  Administered 2021-04-06 – 2021-04-08 (×3): 25 mg via ORAL
  Filled 2021-04-05 (×4): qty 1

## 2021-04-05 MED ORDER — LACTATED RINGERS IV SOLN: INTRAVENOUS | Status: DC

## 2021-04-05 MED ORDER — CALCIUM CARBONATE-VITAMIN D 500-200 MG-UNIT PO TABS
1.0000 | ORAL_TABLET | Freq: Every day | ORAL | Status: DC
  Administered 2021-04-05: 1 via ORAL
  Filled 2021-04-05: qty 1

## 2021-04-05 NOTE — Progress Notes (Signed)
Subjective: 1 Day Post-Op Procedure(s) (LRB): OPEN REDUCTION INTERNAL FIXATION (ORIF) TIBIA/FIBULA FRACTURE (Left)  Patient reports pain as mild to moderate.  Had pain overnight but now better controlled with regular medication. Has been elevating operative limb on 2 pillows.  Objective:   VITALS:  Temp:  [98 F (36.7 C)-99.2 F (37.3 C)] 98 F (36.7 C) (10/09 0753) Pulse Rate:  [94-111] 94 (10/09 0753) Resp:  [14-20] 18 (10/09 0753) BP: (94-133)/(61-76) 94/61 (10/09 0753) SpO2:  [90 %-98 %] 96 % (10/09 0753)  LLE: Bulky short leg splint in place Neurovascular intact Sensation intact distally Compartment soft No pain with passive stretch Wiggles toes CR<2s  LABS Recent Labs    04/02/21 1644 04/02/21 2253 04/03/21 0449 04/05/21 0717  HGB 11.1* 11.2* 11.4* 8.7*  WBC 6.3 8.1 7.5 8.8  PLT 154 161 154 129*   Recent Labs    04/03/21 0449 04/05/21 0717  NA 138 137  K 4.5 4.1  CL 106 103  CO2 26 28  BUN 19 11  CREATININE 0.83 0.93  GLUCOSE 126* 112*   Recent Labs    04/02/21 1644  INR 1.2     Assessment/Plan: 1 Day Post-Op Procedure(s) (LRB): OPEN REDUCTION INTERNAL FIXATION (ORIF) TIBIA/FIBULA FRACTURE (Left)  -doing well now POD1 s/p left distal tibia and fibula ORIF -IV clindamycin 600 mg x 24 hrs -strict NWB LLE, maximum elevation -maintain short leg splint LLE until follow up -pain meds per primary team -DVT Ppx: Aspirin 325 mg once daily x 30 days (start today 04/05/21) -physical therapy to assist with gait and NWB restrictions -discharge home per primary team -sutures out in 2-3 weeks with exchange of short leg splint to short leg cast in outpatient office (line: [phone])  Armond Hang 04/05/2021, 9:21 AM

## 2021-04-05 NOTE — Progress Notes (Addendum)
PROGRESS NOTE  Carol [name]:[id number] DOB: Jul 01, 1970 DOA: 04/02/2021 PCP: Leighton Ruff, MD  HPI/Recap of past 24 hours: Admit date:04/02/2021 Chief compliant: left ankle pain 50 year old female  with medical history significant of GERD, hyperlipidemia, chronic anxiety/depression who presented to the ED after she fell off of the monkey bars, experienced immediate pain and deformity of the left ankle and was unable to ambulate.  In the emergency room imaging studies demonstrated a distal tib-fib fracture.  She was given conscious sedation and a gentle manipulation maneuver was applied by the emergency room staff and a splint was applied.  Orthopedic consultation was requested. Initially was felt as though the patient would be able to be discharged however due to increased pain and the fact that she lives on the second story she felt as though would be too difficult to go home--ortho recommended to admit to medicine and consult Foot/ankle surgery for ORIF as inpatient.  Post open reduction internal fixation TBI/fibular fracture on 04/04/2021 by Dr. Kathaleen Bury.  04/05/2021: Patient was seen and examined at her bedside.  She states her pain in her left lower extremity is well controlled as long as she stays on schedule of her pain medications.  Assessment/Plan: Principal Problem:   Closed left ankle fracture Active Problems:   Anxiety   Gastroesophageal reflux disease   Grover's disease   Hypercholesterolemia   Seborrheic keratosis   Restless legs syndrome  POD #1 post ORIF left ankle fracture post mechanical fall: Involved the distal tibiofibular region.   Pain management in place, bowel regimen for opioid-induced constipation Received IV fluid hydration, DC'd on 04/05/2021. Received IV clindamycin perioperatively. Per orthopedic surgery no weightbearing restriction.  Sutures out in 2 to 3 weeks with exchange of short leg splint to a short leg cast in outpatient office.  Full dose  aspirin 325 mg daily x30 days for DVT prophylaxis. Vitamin D3 level normal 54. Wean off IV pain medication for DC planning. Seen by OT with recommendation for no further OT follow-up.  DME 3 and 1 bedside commode.  Acute blood loss anemia postsurgery Baseline hemoglobin 11.4 Drop in hemoglobin this morning down to 8.7 Repeat CBC in the morning Transfuse hemoglobin less than 7 or less than 8 if symptomatic.   Chronic anxiety disorder: Restart home Celexa.   Seborrheic keratosis: Restart home Lamisil.   Restless leg syndrome: Does not appear to be on any home regimen.  Continue to monitor   Hyperlipidemia: Not on a statin.   DVT prophylaxis: Aspirin 325 mg daily x30 days. Code Status: Full code Family / Patient Communication: Husband and daughter at bedside.    Disposition Plan:   Status is: Inpatient   Remains inpatient appropriate because:Inpatient level of care appropriate due to severity of illness   Dispo: The patient is from: Home              Anticipated d/c is to: Home possibly with home health services              Patient currently is not medically stable to d/c.              Difficult to place patient No     Status is: Inpatient    Dispo:  Patient From: Home  Planned Disposition: Home with Health Care Svc  Medically stable for discharge: No          Objective: Vitals:   04/04/21 1611 04/04/21 1657 04/04/21 2037 04/05/21 0753  BP: 116/74 119/76 110/69 94/61  Pulse: Marland Kitchen)  102 (!) 104 (!) 104 94  Resp: 20 16  18   Temp: 98.6 F (37 C) 99.2 F (37.3 C) 98.8 F (37.1 C) 98 F (36.7 C)  TempSrc:  Oral Oral   SpO2: 95% 94% 98% 96%  Weight:      Height:        Intake/Output Summary (Last 24 hours) at 04/05/2021 1242 Last data filed at 04/05/2021 1136 Gross per 24 hour  Intake 4066.58 ml  Output 1150 ml  Net 2916.58 ml   Filed Weights   04/02/21 1544 04/03/21 1659  Weight: 49.4 kg 49.4 kg    Exam:  General: 50 y.o. year-old female  well-developed well-nourished in no acute distress.  She is alert and oriented x3.   Cardiovascular: Regular rate and rhythm no rubs or gallops.  No JVD or thyromegaly noted. Respiratory: Clear to auscultation with no wheezes or rales.  Abdomen: Soft nontender normal bowel sounds present. Musculoskeletal: Left lower extremity in splint. Skin: No ulcerative lesions noted. Psychiatry: Mood is appropriate for condition and setting.   Data Reviewed: CBC: Recent Labs  Lab 04/02/21 1644 04/02/21 2253 04/03/21 0449 04/05/21 0717  WBC 6.3 8.1 7.5 8.8  NEUTROABS 4.6  --   --   --   HGB 11.1* 11.2* 11.4* 8.7*  HCT 33.2* 33.7* 34.8* 26.1*  MCV 90.5 91.3 92.3 89.7  PLT 154 161 154 003*   Basic Metabolic Panel: Recent Labs  Lab 04/02/21 1644 04/02/21 2253 04/03/21 0449 04/05/21 0717  NA 139  --  138 137  K 3.9  --  4.5 4.1  CL 109  --  106 103  CO2 23  --  26 28  GLUCOSE 105*  --  126* 112*  BUN 25*  --  19 11  CREATININE 0.85 0.91 0.83 0.93  CALCIUM 8.3*  --  8.7* 8.1*  MG  --   --   --  1.7  PHOS  --   --   --  3.4   GFR: Estimated Creatinine Clearance: 54 mL/min (by C-G formula based on SCr of 0.93 mg/dL). Liver Function Tests: Recent Labs  Lab 04/03/21 0449  AST 28  ALT 19  ALKPHOS 47  BILITOT 0.6  PROT 6.5  ALBUMIN 4.0   No results for input(s): LIPASE, AMYLASE in the last 168 hours. No results for input(s): AMMONIA in the last 168 hours. Coagulation Profile: Recent Labs  Lab 04/02/21 1644  INR 1.2   Cardiac Enzymes: No results for input(s): CKTOTAL, CKMB, CKMBINDEX, TROPONINI in the last 168 hours. BNP (last 3 results) No results for input(s): PROBNP in the last 8760 hours. HbA1C: No results for input(s): HGBA1C in the last 72 hours. CBG: No results for input(s): GLUCAP in the last 168 hours. Lipid Profile: No results for input(s): CHOL, HDL, LDLCALC, TRIG, CHOLHDL, LDLDIRECT in the last 72 hours. Thyroid Function Tests: No results for input(s): TSH,  T4TOTAL, FREET4, T3FREE, THYROIDAB in the last 72 hours. Anemia Panel: No results for input(s): VITAMINB12, FOLATE, FERRITIN, TIBC, IRON, RETICCTPCT in the last 72 hours. Urine analysis: No results found for: COLORURINE, APPEARANCEUR, LABSPEC, PHURINE, GLUCOSEU, HGBUR, BILIRUBINUR, KETONESUR, PROTEINUR, UROBILINOGEN, NITRITE, LEUKOCYTESUR Sepsis Labs: @LABRCNTIP (procalcitonin:4,lacticidven:4)  ) Recent Results (from the past 240 hour(s))  Resp Panel by RT-PCR (Flu A&B, Covid) Nasopharyngeal Swab     Status: None   Collection Time: 04/02/21  5:14 PM   Specimen: Nasopharyngeal Swab; Nasopharyngeal(NP) swabs in vial transport medium  Result Value Ref Range Status   SARS Coronavirus  2 by RT PCR NEGATIVE NEGATIVE Final    Comment: (NOTE) SARS-CoV-2 target nucleic acids are NOT DETECTED.  The SARS-CoV-2 RNA is generally detectable in upper respiratory specimens during the acute phase of infection. The lowest concentration of SARS-CoV-2 viral copies this assay can detect is 138 copies/mL. A negative result does not preclude SARS-Cov-2 infection and should not be used as the sole basis for treatment or other patient management decisions. A negative result may occur with  improper specimen collection/handling, submission of specimen other than nasopharyngeal swab, presence of viral mutation(s) within the areas targeted by this assay, and inadequate number of viral copies(<138 copies/mL). A negative result must be combined with clinical observations, patient history, and epidemiological information. The expected result is Negative.  Fact Sheet for Patients:  EntrepreneurPulse.com.au  Fact Sheet for Healthcare Providers:  IncredibleEmployment.be  This test is no t yet approved or cleared by the [place] FDA and  has been authorized for detection and/or diagnosis of SARS-CoV-2 by FDA under an Emergency Use Authorization (EUA). This EUA will remain   in effect (meaning this test can be used) for the duration of the COVID-19 declaration under Section 564(b)(1) of the Act, 21 U.S.C.section 360bbb-3(b)(1), unless the authorization is terminated  or revoked sooner.       Influenza A by PCR NEGATIVE NEGATIVE Final   Influenza B by PCR NEGATIVE NEGATIVE Final    Comment: (NOTE) The Xpert Xpress SARS-CoV-2/FLU/RSV plus assay is intended as an aid in the diagnosis of influenza from Nasopharyngeal swab specimens and should not be used as a sole basis for treatment. Nasal washings and aspirates are unacceptable for Xpert Xpress SARS-CoV-2/FLU/RSV testing.  Fact Sheet for Patients: EntrepreneurPulse.com.au  Fact Sheet for Healthcare Providers: IncredibleEmployment.be  This test is not yet approved or cleared by the [place] FDA and has been authorized for detection and/or diagnosis of SARS-CoV-2 by FDA under an Emergency Use Authorization (EUA). This EUA will remain in effect (meaning this test can be used) for the duration of the COVID-19 declaration under Section 564(b)(1) of the Act, 21 U.S.C. section 360bbb-3(b)(1), unless the authorization is terminated or revoked.  Performed at [facility], [place] [address] [place], [place] [address]   MRSA Next Gen by PCR, Nasal     Status: None   Collection Time: 04/04/21  8:26 AM   Specimen: Nasal Mucosa; Nasal Swab  Result Value Ref Range Status   MRSA by PCR Next Gen NOT DETECTED NOT DETECTED Final    Comment: (NOTE) The GeneXpert MRSA Assay (FDA approved for NASAL specimens only), is one component of a comprehensive MRSA colonization surveillance program. It is not intended to diagnose MRSA infection nor to guide or monitor treatment for MRSA infections. Test performance is not FDA approved in patients less than 58 years old. Performed at [facility], [place] [address] [place], [place] [address]       Studies: DG  Ankle Complete Left  Result Date: 04/04/2021 CLINICAL DATA:  ORIF EXAM: LEFT ANKLE COMPLETE - 3+ VIEW COMPARISON:  04/02/2021 FINDINGS: 8 fluoroscopic images are obtained during the performance of the procedure and are provided for interpretation only. Images demonstrate plate and screw fixations across the distal fibular and tibial fractures, with near anatomic alignment. Please refer to the operative report. FLUOROSCOPY TIME:  4 minutes 32 seconds IMPRESSION: 1. ORIF left ankle. Electronically Signed   By: Randa Ngo M.D.   On: 04/04/2021 15:25   DG C-Arm 1-60 Min-No Report  Result Date: 04/04/2021 Fluoroscopy was utilized by the requesting physician.  No radiographic interpretation.   DG C-Arm 1-60 Min-No Report  Result Date: 04/04/2021 Fluoroscopy was utilized by the requesting physician.  No radiographic interpretation.   DG C-Arm 1-60 Min-No Report  Result Date: 04/04/2021 Fluoroscopy was utilized by the requesting physician.  No radiographic interpretation.    Scheduled Meds:  aspirin  325 mg Oral Daily   citalopram  20 mg Oral QODAY   multivitamin with minerals  1 tablet Oral Daily   senna-docusate  2 tablet Oral QHS    Continuous Infusions:  clindamycin (CLEOCIN) IV 600 mg (04/05/21 0500)     LOS: 3 days     Kayleen Memos, MD [facility] Pager [phone]  If 7PM-7AM, please contact night-coverage [email or web address] Password [email or web address] 04/05/2021, 12:42 PM

## 2021-04-05 NOTE — Evaluation (Signed)
Physical Therapy Evaluation Patient Details Name: Carol [name] MRN: [id number] DOB: February 28, 1971 Today's Date: 04/05/2021  History of Present Illness  Carol [name] is a 50 y.o. female with medical history significant of basal cell carcinoma, depression, anxiety, who presented to the ER after she jumped off of the monkey bars landing on the wrong side injuring her ankle. X-ray:Comminuted transverse fracture of the distal tibial metaphysis and mildly comminuted transverse distal fibular metaphysis fracture. Pt s/p ORIF of distal tibia/fibula.  Clinical Impression  Patient is s/p above surgery resulting in functional limitations due to the deficits listed below (see PT Problem List). Independent at baseline; lives with family; Presents to PT with L Lower leg pain and strict NWB precautions that id effecting pt's functional mobility; Today, pt was able to stand and maintain NWB with a few steps with RW; REported what could be pre-syncopal symptoms, so checked out orthostatic BPs, which wer eon the low side, but did not demonstrate a drop with standing and upright activity; demonstrated crutch use, and plan for crutch training including stair next session; Patient will benefit from skilled PT to increase their independence and safety with mobility to allow discharge to the venue listed below.          Recommendations for follow up therapy are one component of a multi-disciplinary discharge planning process, led by the attending physician.  Recommendations may be updated based on patient status, additional functional criteria and insurance authorization.  Follow Up Recommendations Home health PT;Supervision/Assistance - 24 hour;Other (comment) (May progress well enough to not need HHPT; will update as appropriate)    Equipment Recommendations  3in1 (PT);Other (comment) (tub bench; will consider RW, though pt may not need it after crutch training)    Recommendations for Other Services OT consult (as ordered)      Precautions / Restrictions Precautions Precautions: Fall Restrictions Weight Bearing Restrictions: Yes LLE Weight Bearing: Non weight bearing      Mobility  Bed Mobility Overal bed mobility: Needs Assistance Bed Mobility: Supine to Sit     Supine to sit: Min assist;HOB elevated     General bed mobility comments: A for LLE    Transfers Overall transfer level: Needs assistance Equipment used: Rolling walker (2 wheeled) Transfers: Sit to/from Stand Sit to Stand: Min assist         General transfer comment: cues for hand placement, and to maintian NWB LLE; overall good rise  Ambulation/Gait Ambulation/Gait assistance: Min guard Gait Distance (Feet): 8 Feet Assistive device: Rolling walker (2 wheeled) Gait Pattern/deviations:  (HOp/swing-to pattern)     General Gait Details: Cues for RW use and taking steps keeping NWB LLE; Overall good steadiness in RW, and good maintenance of NWB LLE; cues to self-monitor for activity tolerance  Stairs            Wheelchair Mobility    Modified Rankin (Stroke Patients Only)       Balance Overall balance assessment: Needs assistance Sitting-balance support: No upper extremity supported;Feet supported Sitting balance-[name]: Good     Standing balance support: Bilateral upper extremity supported Standing balance-[name]: Poor                               Pertinent Vitals/Pain Pain Assessment: 0-10 Pain Score: 7  Pain Location: LLE Pain Descriptors / Indicators: Aching;Sore Pain Intervention(s): Limited activity within patient's tolerance;Monitored during session;Patient requesting pain meds-RN notified    Home Living Family/patient expects to be  discharged to:: Private residence Living Arrangements: Spouse/significant other;Children Available Help at Discharge: Family;Available 24 hours/day Type of Home: House Home Access: Level entry     Home Layout: One level Home Equipment:  None      Prior Function Level of Independence: Independent               Hand Dominance   Dominant Hand: Right    Extremity/Trunk Assessment   Upper Extremity Assessment Upper Extremity Assessment: Overall WFL for tasks assessed    Lower Extremity Assessment Lower Extremity Assessment: LLE deficits/detail LLE Deficits / Details: Ankle immobilized in splint postop; positive active toe wiggle; hip and knee ROM WFL; some weakness, with difficulty lifting LLE    Cervical / Trunk Assessment Cervical / Trunk Assessment: Normal  Communication   Communication: No difficulties  Cognition Arousal/Alertness: Awake/alert Behavior During Therapy: WFL for tasks assessed/performed Overall Cognitive Status: Within Functional Limits for tasks assessed                                        General Comments General comments (skin integrity, edema, etc.): Session conducted on room air and O2 sats 94% at end of session; Reported feeling hot and not quite right sitting up; Orthostatics obtained, which were low, but did not show a drop from supine to sit and to stand    Exercises     Assessment/Plan    PT Assessment Patient needs continued PT services  PT Problem List Decreased strength;Decreased activity tolerance;Decreased balance;Decreased mobility;Decreased knowledge of use of DME;Decreased safety awareness;Pain       PT Treatment Interventions DME instruction;Gait training;Stair training;Functional mobility training;Therapeutic activities;Therapeutic exercise;Balance training;Patient/family education    PT Goals (Current goals can be found in the Care Plan section)  Acute Rehab PT Goals Patient Stated Goal: to go home tomorrow PT Goal Formulation: With patient Time For Goal Achievement: 04/12/21 Potential to Achieve Goals: Good    Frequency Min 5X/week   Barriers to discharge        Co-evaluation PT/OT/SLP Co-Evaluation/Treatment: Yes Reason for  Co-Treatment: For patient/therapist safety;To address functional/ADL transfers PT goals addressed during session: Mobility/safety with mobility OT goals addressed during session: Strengthening/ROM;ADL's and self-care       AM-PAC PT "6 Clicks" Mobility  Outcome Measure Help needed turning from your back to your side while in a flat bed without using bedrails?: None Help needed moving from lying on your back to sitting on the side of a flat bed without using bedrails?: A Little Help needed moving to and from a bed to a chair (including a wheelchair)?: A Little Help needed standing up from a chair using your arms (e.g., wheelchair or bedside chair)?: A Little Help needed to walk in hospital room?: A Little Help needed climbing 3-5 steps with a railing? : A Lot 6 Click Score: 18    End of Session Equipment Utilized During Treatment: Gait belt Activity Tolerance: Patient tolerated treatment well Patient left: in chair;with call bell/phone within reach;with chair alarm set Nurse Communication: Mobility status PT Visit Diagnosis: Other abnormalities of gait and mobility (R26.89);Pain Pain - Right/Left: Left Pain - part of body: Leg    Time: 9735-3299 PT Time Calculation (min) (ACUTE ONLY): 44 min   Charges:   PT Evaluation $PT Eval Moderate Complexity: 1 Mod PT Treatments $Gait Training: 8-22 mins        Roney Marion, PT  Acute Rehabilitation  Services Pager [phone] Office [phone]   Colletta [place] 04/05/2021, 1:03 PM

## 2021-04-05 NOTE — Evaluation (Signed)
Occupational Therapy Evaluation Patient Details Name: Leonetta Mcgivern MRN: [id number] DOB: [birth year] Today's Date: 04/05/2021   History of Present Illness Chianna Lowdermilk is a 50 y.o. female with medical history significant of basal cell carcinoma, depression, anxiety, who presented to the ER after she jumped off of the monkey bars landing on the wrong side injuring her ankle. X-ray:Comminuted transverse fracture of the distal tibial metaphysis and mildly comminuted transverse distal fibular metaphysis fracture. Pt s/p ORIF of distal tibia/fibula.   Clinical Impression   This 50 yo female admitted and underwent above presents to acute OT with PLOF of being totally independent with all basic and IADLs. Currently she is setup/S-min A for basic ADLs and mobility from a RW standpoint. She will continue to benefit from acute OT without need for follow up.      Recommendations for follow up therapy are one component of a multi-disciplinary discharge planning process, led by the attending physician.  Recommendations may be updated based on patient status, additional functional criteria and insurance authorization.   Follow Up Recommendations  No OT follow up;Supervision - Intermittent    Equipment Recommendations  3 in 1 bedside commode       Precautions / Restrictions Precautions Precautions: Fall Restrictions Weight Bearing Restrictions: Yes LLE Weight Bearing: Non weight bearing      Mobility Bed Mobility Overal bed mobility: Needs Assistance Bed Mobility: Supine to Sit     Supine to sit: Min assist;HOB elevated     General bed mobility comments: A for LLE    Transfers Overall transfer level: Needs assistance Equipment used: Rolling walker (2 wheeled) Transfers: Sit to/from Stand Sit to Stand: Min assist              Balance Overall balance assessment: Needs assistance Sitting-balance support: No upper extremity supported;Feet supported Sitting balance-[name]: Good      Standing balance support: Bilateral upper extremity supported Standing balance-[name]: Poor                             ADL either performed or assessed with clinical judgement   ADL Overall ADL's : Needs assistance/impaired Eating/Feeding: Independent;Sitting   Grooming: Set up;Sitting   Upper Body Bathing: Set up;Sitting   Lower Body Bathing: Minimal assistance;Sit to/from stand   Upper Body Dressing : Set up;Sitting   Lower Body Dressing: Minimal assistance;Sit to/from stand   Toilet Transfer: Minimal assistance;Ambulation;RW Toilet Transfer Details (indicate cue type and reason): simulated: bed>turn to recliner>10 hops forward>sit in recliner behind her Toileting- Clothing Manipulation and Hygiene: Minimal assistance;Sit to/from Nurse, children's Details (indicate cue type and reason): Spoke about need for tub bench and they will get one on their own. Also spoke about how to keep cast dry (short leg cast cover, wrapping in press n seal, keeping it propped on outside ledge of tub with slit in shower curtain to keep water off, hand held shower head.         Vision Patient Visual Report: No change from baseline              Pertinent Vitals/Pain Pain Assessment: 0-10 Pain Score: 7  Pain Location: LLE Pain Descriptors / Indicators: Aching;Sore Pain Intervention(s): Limited activity within patient's tolerance;Monitored during session;Repositioned;Patient requesting pain meds-RN notified (Made RN aware x2 while we were in room)     Hand Dominance Right   Extremity/Trunk Assessment Upper Extremity Assessment Upper Extremity Assessment: Overall WFL for  tasks assessed           Communication Communication Communication: No difficulties   Cognition Arousal/Alertness: Awake/alert Behavior During Therapy: WFL for tasks assessed/performed Overall Cognitive Status: Within Functional Limits for tasks assessed                                                 Home Living Family/patient expects to be discharged to:: Private residence Living Arrangements: Spouse/significant other;Children Available Help at Discharge: Family;Available 24 hours/day Type of Home: House Home Access: Level entry     Home Layout: One level     Bathroom Shower/Tub: Tub/shower unit;Curtain   Bathroom Toilet: Standard     Home Equipment: None          Prior Functioning/Environment Level of Independence: Independent                 OT Problem List: Decreased strength;Impaired balance (sitting and/or standing);Pain;Decreased knowledge of precautions;Decreased knowledge of use of DME or AE      OT Treatment/Interventions: Self-care/ADL training;DME and/or AE instruction;Patient/family education;Balance training    OT Goals(Current goals can be found in the care plan section) Acute Rehab OT Goals Patient Stated Goal: to go home tomorrow OT Goal Formulation: With patient Time For Goal Achievement: 04/19/21 Potential to Achieve Goals: Good  OT Frequency: Min 2X/week           Co-evaluation PT/OT/SLP Co-Evaluation/Treatment: Yes Reason for Co-Treatment: For patient/therapist safety;To address functional/ADL transfers PT goals addressed during session: Mobility/safety with mobility;Balance;Proper use of DME;Strengthening/ROM OT goals addressed during session: Strengthening/ROM;ADL's and self-care      AM-PAC OT "6 Clicks" Daily Activity     Outcome Measure Help from another person eating meals?: None Help from another person taking care of personal grooming?: A Little Help from another person toileting, which includes using toliet, bedpan, or urinal?: A Little Help from another person bathing (including washing, rinsing, drying)?: A Little Help from another person to put on and taking off regular upper body clothing?: A Little Help from another person to put on and taking off regular lower body clothing?: A  Little 6 Click Score: 19   End of Session Equipment Utilized During Treatment: Gait belt;Rolling walker Nurse Communication: Patient requests pain meds (NT: mobility status +1 A)  Activity Tolerance: Patient tolerated treatment well Patient left: in chair;with call bell/phone within reach;with chair alarm set  OT Visit Diagnosis: Unsteadiness on feet (R26.81);Other abnormalities of gait and mobility (R26.89);Pain;Muscle weakness (generalized) (M62.81) Pain - Right/Left: Left Pain - part of body: Ankle and joints of foot                Time: 2197-5883 OT Time Calculation (min): 35 min Charges:  OT General Charges $OT Visit: 1 Visit OT Evaluation $OT Eval Moderate Complexity: 1 Mod  Golden Circle, OTR/L Acute [employer] Pager [phone] Office [phone]    Almon Register 04/05/2021, 11:05 AM

## 2021-04-05 NOTE — Plan of Care (Signed)

## 2021-04-06 ENCOUNTER — Other Ambulatory Visit: Payer: Self-pay

## 2021-04-06 DIAGNOSIS — S82892A Other fracture of left lower leg, initial encounter for closed fracture: Secondary | ICD-10-CM | POA: Diagnosis not present

## 2021-04-06 LAB — CBC
HCT: 29.2 % — ABNORMAL LOW (ref 36.0–46.0)
Hemoglobin: 9.6 g/dL — ABNORMAL LOW (ref 12.0–15.0)
MCH: 30.1 pg (ref 26.0–34.0)
MCHC: 32.9 g/dL (ref 30.0–36.0)
MCV: 91.5 fL (ref 80.0–100.0)
Platelets: 159 K/uL (ref 150–400)
RBC: 3.19 MIL/uL — ABNORMAL LOW (ref 3.87–5.11)
RDW: 12.3 % (ref 11.5–15.5)
WBC: 7.5 K/uL (ref 4.0–10.5)
nRBC: 0 % (ref 0.0–0.2)

## 2021-04-06 MED ORDER — OXYCODONE-ACETAMINOPHEN 7.5-325 MG PO TABS: 1.0000 | ORAL_TABLET | Freq: Three times a day (TID) | ORAL | 0 refills | Status: AC | PRN

## 2021-04-06 MED ORDER — DIPHENHYDRAMINE HCL 50 MG/ML IJ SOLN
12.5000 mg | Freq: Four times a day (QID) | INTRAMUSCULAR | Status: DC | PRN
  Administered 2021-04-07 (×2): 12.5 mg via INTRAVENOUS
  Filled 2021-04-06 (×2): qty 1

## 2021-04-06 MED ORDER — CAMPHOR-MENTHOL 0.5-0.5 % EX LOTN
TOPICAL_LOTION | CUTANEOUS | Status: AC
  Filled 2021-04-06 (×2): qty 222

## 2021-04-06 MED ORDER — ASPIRIN 325 MG PO TABS: 325.0000 mg | ORAL_TABLET | Freq: Every day | ORAL | 0 refills | Status: AC

## 2021-04-06 MED ORDER — DIPHENHYDRAMINE HCL 50 MG/ML IJ SOLN
12.5000 mg | INTRAMUSCULAR | Status: AC
  Administered 2021-04-06: 12.5 mg via INTRAVENOUS
  Filled 2021-04-06: qty 1

## 2021-04-06 NOTE — Progress Notes (Signed)
PT Cancellation Note  Patient Details Name: Carol [name] MRN: [id number] DOB: February 21, 1971   Cancelled Treatment:    Reason Eval/Treat Not Completed: Other (comment)  Pt currently with a rash that is itching and quite distracting;  Notified Dr. Nevada Crane;   Will return for gait and stair training;  Noting plans for dc today;   Roney Marion, PT  Acute Rehabilitation Services Pager [phone] Office [phone]    Colletta [place] 04/06/2021, 12:06 PM

## 2021-04-06 NOTE — Discharge Summary (Addendum)
Discharge Summary  Carol [name] YKD:[id number] DOB: [birth year]  PCP: Leighton Ruff, MD  Admit date: 04/02/2021 Discharge date: 04/06/2021  Time spent: 35 minutes.  Recommendations for Outpatient Follow-up:  Follow-up with orthopedic surgery Follow-up with your primary care provider Continue PT OT with assistance and fall precautions.  Discharge Diagnoses:  Active Hospital Problems   Diagnosis Date Noted   Closed left ankle fracture 04/02/2021   Restless legs syndrome 06/18/2020   Hypercholesterolemia 06/18/2020   Gastroesophageal reflux disease 06/18/2020   Anxiety 06/18/2020   Grover's disease 09/15/2015   Seborrheic keratosis 01/25/2012    Resolved Hospital Problems  No resolved problems to display.    Discharge Condition: Stable  Diet recommendation: Resume previous diet.  Vitals:   04/06/21 0839 04/06/21 1316  BP: 117/78   Pulse: 83 95  Resp: 18 19  Temp: 98.3 F (36.8 C) 98.8 F (37.1 C)  SpO2: 90%     History of present illness:  50 year old female  with medical history significant of GERD, hyperlipidemia, chronic anxiety/depression who presented to [facility] ED after she fell off of the monkey bars, experienced immediate pain and deformity of the left ankle and was unable to ambulate.  In the emergency room imaging studies demonstrated a distal tib-fib fracture.  She was given conscious sedation and a gentle manipulation maneuver was performed by the emergency room staff and a splint was applied.  She was seen by orthopedic surgery, post open reduction internal fixation TBI/fibular fracture on 04/04/2021 by Dr. Kathaleen Bury.  Recommended strict nonweightbearing to left lower extremity and maximum elevation.  To maintain short leg splint left lower extremity until follow-up.  Aspirin 325 mg daily x30 days for DVT prophylaxis.  Physical therapy to assist with gait and nonweightbearing restrictions.  Sutures out in 2 to 3 weeks with exchange of short leg splint to short leg  cast in outpatient office.  She was evaluated by PT OT on 04/05/2021 with recommendation for home health PT.   04/06/2021: Seen at her bedside.  Vital signs and labs reviewed and are stable.  Hemoglobin uptrending from 8.7 to 9.6 this morning.  There were no acute events overnight.  Addendum: Developed a new rash, unclear if it is from pain medication or antibiotics.  IV/p.o. Benadryl as needed for itching  Hospital Course:  Principal Problem:   Closed left ankle fracture Active Problems:   Anxiety   Gastroesophageal reflux disease   Grover's disease   Hypercholesterolemia   Seborrheic keratosis   Restless legs syndrome  POD #2 post ORIF left ankle fracture post mechanical fall, on 04/04/2021 by orthopedic surgery, Dr. Kathaleen Bury. Distal tibiofibular.   Received IV clindamycin and IV fluid perioperatively. Per orthopedic surgery no weightbearing restriction.  Sutures out in 2 to 3 weeks with exchange of short leg splint to a short leg cast in outpatient office.  Full dose aspirin 325 mg daily x30 days for DVT prophylaxis. Seen by PT OT, PT recommended home health PT.   DME 3 and 1 bedside commode, DME rolling walker, DME tub bench. Follow-up with orthopedic surgery.   Improving, acute blood loss anemia postsurgery Baseline hemoglobin 11.4 Hemoglobin Trending, 9.6 from 8.7 Follow-up with your PCP.   Chronic anxiety disorder: Continue home Celexa.   Seborrheic keratosis: Recommend to follow up with your PCP or dermatology.  In the hospital received Sarna prn for dry skin.  Skin rash, diffuse Patient has several allergies unclear if it is from pain medication or antibiotics.  IV/p.o. Benadryl as needed for itching  DVT prophylaxis: Aspirin 325 mg daily x30 days.  Code Status: Full code    Procedures:  ORIF left ankle fracture post mechanical fall on 04/04/2021 by orthopedic surgery, Dr. Kathaleen Bury.  Consultations: Orthopedic surgery  Discharge Exam: BP 117/78 (BP  Location: Right Arm)   Pulse 95   Temp 98.8 F (37.1 C) (Oral)   Resp 19   Ht 5\' 1"  (1.549 m)   Wt 49.4 kg   LMP 07/31/2018   SpO2 90%   BMI 20.60 kg/m  General: 50 y.o. year-old female well developed well nourished in no acute distress.  Alert and oriented x3. Cardiovascular: Regular rate and rhythm with no rubs or gallops.  No thyromegaly or JVD noted.   Respiratory: Clear to auscultation with no wheezes or rales. Good inspiratory effort. Abdomen: Soft nontender nondistended with normal bowel sounds x4 quadrants. Musculoskeletal: Left lower extremity in splint.   Skin: Rash affecting back, groins bilaterally. Psychiatry: Mood is appropriate for condition and setting  Discharge Instructions You were cared for by a hospitalist during your hospital stay. If you have any questions about your discharge medications or the care you received while you were in the hospital after you are discharged, you can call the unit and asked to speak with the hospitalist on call if the hospitalist that took care of you is not available. Once you are discharged, your primary care physician will handle any further medical issues. Please note that NO REFILLS for any discharge medications will be authorized once you are discharged, as it is imperative that you return to your primary care physician (or establish a relationship with a primary care physician if you do not have one) for your aftercare needs so that they can reassess your need for medications and monitor your lab values.   Allergies as of 04/06/2021       Reactions   Doxycycline Rash   Keflex [cephalexin] Rash   Minocycline Rash   Sulfa Antibiotics Rash   Other reaction(s): Other (See Comments), Other (See Comments)   Sulfacetamide Sodium Rash        Medication List     TAKE these medications    aspirin 325 MG tablet Take 1 tablet (325 mg total) by mouth daily. Start taking on: April 07, 2021   citalopram 20 MG tablet Commonly  known as: CELEXA Take 20 mg by mouth every other day. What changed: Another medication with the same name was removed. Continue taking this medication, and follow the directions you see here.   fluticasone 50 MCG/ACT nasal spray Commonly known as: FLONASE Place 1 spray into both nostrils daily as needed for allergies.   HAIR SKIN & NAILS ADVANCED PO Take 1 tablet by mouth daily.   multivitamin with minerals Tabs tablet Take 1 tablet by mouth daily.   oxyCODONE-acetaminophen 7.5-325 MG tablet Commonly known as: PERCOCET Take 1-2 tablets by mouth every 8 (eight) hours as needed for up to 5 days for severe pain.   scopolamine 1 MG/3DAYS Commonly known as: TRANSDERM-SCOP Place 1 patch onto the skin every 3 (three) days.   terbinafine 250 MG tablet Commonly known as: LamISIL Take 1 tablet (250 mg total) by mouth daily.               Durable Medical Equipment  (From admission, onward)           Start     Ordered   04/06/21 1125  For home use only DME Walker rolling  Once  Question Answer Comment  Walker: With [place]   Patient needs a walker to treat with the following condition Weakness      04/06/21 1124   04/05/21 1845  For home use only DME Tub bench  Once        04/05/21 1844   04/05/21 1243  For home use only DME 3 n 1  Once        04/05/21 1242           Allergies  Allergen Reactions   Doxycycline Rash   Keflex [Cephalexin] Rash   Minocycline Rash   Sulfa Antibiotics Rash    Other reaction(s): Other (See Comments), Other (See Comments)   Sulfacetamide Sodium Rash    Follow-up Information     Leighton Ruff, MD. Call in 1 day(s).   Specialty: Family Medicine Why: Please call for a posthospital follow-up appointment. Contact information: [place] [address] [phone]         Armond Hang, MD. Call in 1 day(s).   Specialty: Orthopedic Surgery Why: Please call for a posthospital follow-up  appointment. Contact information: [address] [place] [address] [phone]         [facility], [facility] Follow up.   Specialty: Home Health Services Contact information: [address]  [place] [address] [phone]         [facility] Follow up.   Why: Company walker and 3 in 1 was ordered through Contact information: [place] [address] [phone]                  The results of significant diagnostics from this hospitalization (including imaging, microbiology, ancillary and laboratory) are listed below for reference.    Significant Diagnostic Studies: DG Tibia/Fibula Left  Result Date: 04/02/2021 CLINICAL DATA:  Fall. Pain is at ankle in just above the ankle. Pulses intact. EXAM: LEFT ANKLE COMPLETE - 3+ VIEW; LEFT TIBIA AND FIBULA - 2 VIEW COMPARISON:  None. FINDINGS: Overlying splint material obscures fine osseous detail. Comminuted transverse fracture of the distal tibia. The tibial plafond appears intact, however there is probable fracture extension into the distal tibiofibular joint. The distal fracture fragment is displaced laterally approximately 1.4 cm with mild medial apex angulation. No significant impaction or distraction. Comminuted transverse fracture of the distal fibula at the level of the distal tibiofibular joint. The distal fracture fragment is displaced approximately 1 cm laterally with 0.9 cm of impaction. Mild medial apex angulation. Talar dome is intact. No ankle or knee joint dislocation. No other fracture in the left lower leg. IMPRESSION: 1. Comminuted mildly displaced transverse fracture of the distal tibia, extending to the distal tibiofibular joint. 2. Comminuted mildly displaced transverse fracture of the distal fibula at the level of the distal tibiofibular joint with mild impaction. 3. No ankle or knee joint dislocation. Electronically Signed   By: Ileana Roup M.D.    On: 04/02/2021 16:58   DG Ankle Complete Left  Result Date: 04/04/2021 CLINICAL DATA:  ORIF EXAM: LEFT ANKLE COMPLETE - 3+ VIEW COMPARISON:  04/02/2021 FINDINGS: 8 fluoroscopic images are obtained during the performance of the procedure and are provided for interpretation only. Images demonstrate plate and screw fixations across the distal fibular and tibial fractures, with near anatomic alignment. Please refer to the operative report. FLUOROSCOPY TIME:  4 minutes 32 seconds IMPRESSION: 1. ORIF left ankle. Electronically Signed   By: Diana Eves.D.  On: 04/04/2021 15:25   DG Ankle Complete Left  Result Date: 04/02/2021 CLINICAL DATA:  Post reduction EXAM: LEFT ANKLE COMPLETE - 3+ VIEW COMPARISON:  04/02/2021 FINDINGS: Comminuted distal tibial fracture with residual 1/4 shaft diameter lateral and just under 1/4 shaft diameter anterior displacement of distal fracture fragment. Mild lateral angulation which is slightly decreased compared to prior. Acute comminuted distal fibular fracture with residual 1/2 shaft diameter lateral displacement. Residual mild lateral angulation of distal fracture fragment, also decreased compared to prior. Mortise appears symmetric. IMPRESSION: Acute comminuted distal tibial and fibular fractures with residual displacement and angulation, though decreased compared to prior Electronically Signed   By: Donavan Foil M.D.   On: 04/02/2021 18:18   DG Ankle Complete Left  Result Date: 04/02/2021 CLINICAL DATA:  Fall. Pain is at ankle in just above the ankle. Pulses intact. EXAM: LEFT ANKLE COMPLETE - 3+ VIEW; LEFT TIBIA AND FIBULA - 2 VIEW COMPARISON:  None. FINDINGS: Overlying splint material obscures fine osseous detail. Comminuted transverse fracture of the distal tibia. The tibial plafond appears intact, however there is probable fracture extension into the distal tibiofibular joint. The distal fracture fragment is displaced laterally approximately 1.4 cm with mild medial  apex angulation. No significant impaction or distraction. Comminuted transverse fracture of the distal fibula at the level of the distal tibiofibular joint. The distal fracture fragment is displaced approximately 1 cm laterally with 0.9 cm of impaction. Mild medial apex angulation. Talar dome is intact. No ankle or knee joint dislocation. No other fracture in the left lower leg. IMPRESSION: 1. Comminuted mildly displaced transverse fracture of the distal tibia, extending to the distal tibiofibular joint. 2. Comminuted mildly displaced transverse fracture of the distal fibula at the level of the distal tibiofibular joint with mild impaction. 3. No ankle or knee joint dislocation. Electronically Signed   By: Ileana Roup M.D.   On: 04/02/2021 16:58   CT ANKLE LEFT WO CONTRAST  Result Date: 04/03/2021 CLINICAL DATA:  Distal tibial and fibular fractures. EXAM: CT OF THE LEFT ANKLE WITHOUT CONTRAST TECHNIQUE: Multidetector CT imaging of the left ankle was performed according to the standard protocol. Multiplanar CT image reconstructions were also generated. COMPARISON:  None. FINDINGS: Bones/Joint/Cartilage Comminuted transverse fracture of the distal tibial metaphysis with 7 mm of lateral displacement and 4 mm of anterior displacement. Mildly comminuted transverse distal fibular metaphysis fracture with 8 mm of lateral displacement and 5 mm of anterior displacement. No other fracture or dislocation. No aggressive osseous lesion. Ankle mortise is intact. No joint effusion. Ligaments Ligaments are suboptimally evaluated by CT. Muscles and Tendons Flexor, extensor, peroneal and Achilles tendons are grossly intact. Plantar fascia is grossly intact. Soft tissue No fluid collection or hematoma. No soft tissue mass. Soft tissue swelling over the medial and lateral aspect of the ankle. IMPRESSION: 1. Comminuted transverse fracture of the distal tibial metaphysis with 7 mm of lateral displacement and 4 mm of anterior  displacement. 2. Mildly comminuted transverse distal fibular metaphysis fracture with 8 mm of lateral displacement and 5 mm of anterior displacement. Electronically Signed   By: Kathreen Devoid M.D.   On: 04/03/2021 18:48   DG C-Arm 1-60 Min-No Report  Result Date: 04/04/2021 Fluoroscopy was utilized by the requesting physician.  No radiographic interpretation.   DG C-Arm 1-60 Min-No Report  Result Date: 04/04/2021 Fluoroscopy was utilized by the requesting physician.  No radiographic interpretation.   DG C-Arm 1-60 Min-No Report  Result Date: 04/04/2021 Fluoroscopy was utilized by the requesting  physician.  No radiographic interpretation.    Microbiology: Recent Results (from the past 240 hour(s))  Resp Panel by RT-PCR (Flu A&B, Covid) Nasopharyngeal Swab     Status: None   Collection Time: 04/02/21  5:14 PM   Specimen: Nasopharyngeal Swab; Nasopharyngeal(NP) swabs in vial transport medium  Result Value Ref Range Status   SARS Coronavirus 2 by RT PCR NEGATIVE NEGATIVE Final    Comment: (NOTE) SARS-CoV-2 target nucleic acids are NOT DETECTED.  The SARS-CoV-2 RNA is generally detectable in upper respiratory specimens during the acute phase of infection. The lowest concentration of SARS-CoV-2 viral copies this assay can detect is 138 copies/mL. A negative result does not preclude SARS-Cov-2 infection and should not be used as the sole basis for treatment or other patient management decisions. A negative result may occur with  improper specimen collection/handling, submission of specimen other than nasopharyngeal swab, presence of viral mutation(s) within the areas targeted by this assay, and inadequate number of viral copies(<138 copies/mL). A negative result must be combined with clinical observations, patient history, and epidemiological information. The expected result is Negative.  Fact Sheet for Patients:  EntrepreneurPulse.com.au  Fact Sheet for Healthcare  Providers:  IncredibleEmployment.be  This test is no t yet approved or cleared by the [place] FDA and  has been authorized for detection and/or diagnosis of SARS-CoV-2 by FDA under an Emergency Use Authorization (EUA). This EUA will remain  in effect (meaning this test can be used) for the duration of the COVID-19 declaration under Section 564(b)(1) of the Act, 21 U.S.C.section 360bbb-3(b)(1), unless the authorization is terminated  or revoked sooner.       Influenza A by PCR NEGATIVE NEGATIVE Final   Influenza B by PCR NEGATIVE NEGATIVE Final    Comment: (NOTE) The Xpert Xpress SARS-CoV-2/FLU/RSV plus assay is intended as an aid in the diagnosis of influenza from Nasopharyngeal swab specimens and should not be used as a sole basis for treatment. Nasal washings and aspirates are unacceptable for Xpert Xpress SARS-CoV-2/FLU/RSV testing.  Fact Sheet for Patients: EntrepreneurPulse.com.au  Fact Sheet for Healthcare Providers: IncredibleEmployment.be  This test is not yet approved or cleared by the [place] FDA and has been authorized for detection and/or diagnosis of SARS-CoV-2 by FDA under an Emergency Use Authorization (EUA). This EUA will remain in effect (meaning this test can be used) for the duration of the COVID-19 declaration under Section 564(b)(1) of the Act, 21 U.S.C. section 360bbb-3(b)(1), unless the authorization is terminated or revoked.  Performed at [facility], [place] [address] [place], [place] [address]   MRSA Next Gen by PCR, Nasal     Status: None   Collection Time: 04/04/21  8:26 AM   Specimen: Nasal Mucosa; Nasal Swab  Result Value Ref Range Status   MRSA by PCR Next Gen NOT DETECTED NOT DETECTED Final    Comment: (NOTE) The GeneXpert MRSA Assay (FDA approved for NASAL specimens only), is one component of a comprehensive MRSA colonization surveillance program. It is  not intended to diagnose MRSA infection nor to guide or monitor treatment for MRSA infections. Test performance is not FDA approved in patients less than 77 years old. Performed at [facility], [place] [address] [place], [place] [address]      Labs: Basic Metabolic Panel: Recent Labs  Lab 04/02/21 1644 04/02/21 2253 04/03/21 0449 04/05/21 0717  NA 139  --  138 137  K 3.9  --  4.5 4.1  CL 109  --  106 103  CO2 23  --  26 28  GLUCOSE 105*  --  126* 112*  BUN 25*  --  19 11  CREATININE 0.85 0.91 0.83 0.93  CALCIUM 8.3*  --  8.7* 8.1*  MG  --   --   --  1.7  PHOS  --   --   --  3.4   Liver Function Tests: Recent Labs  Lab 04/03/21 0449  AST 28  ALT 19  ALKPHOS 47  BILITOT 0.6  PROT 6.5  ALBUMIN 4.0   No results for input(s): LIPASE, AMYLASE in the last 168 hours. No results for input(s): AMMONIA in the last 168 hours. CBC: Recent Labs  Lab 04/02/21 1644 04/02/21 2253 04/03/21 0449 04/05/21 0717 04/06/21 0659  WBC 6.3 8.1 7.5 8.8 7.5  NEUTROABS 4.6  --   --   --   --   HGB 11.1* 11.2* 11.4* 8.7* 9.6*  HCT 33.2* 33.7* 34.8* 26.1* 29.2*  MCV 90.5 91.3 92.3 89.7 91.5  PLT 154 161 154 129* 159   Cardiac Enzymes: No results for input(s): CKTOTAL, CKMB, CKMBINDEX, TROPONINI in the last 168 hours. BNP: BNP (last 3 results) No results for input(s): BNP in the last 8760 hours.  ProBNP (last 3 results) No results for input(s): PROBNP in the last 8760 hours.  CBG: No results for input(s): GLUCAP in the last 168 hours.     Signed:  Kayleen Memos, MD [facility] 04/06/2021, 1:24 PM

## 2021-04-06 NOTE — Progress Notes (Signed)
OT Cancellation Note  Patient Details Name: Carol [name] MRN: [id number] DOB: 06-Nov-1970   Cancelled Treatment:    Reason Eval/Treat Not Completed: Other (comment). Attempted to see patient at 9:04 this AM and lights were off and dtr was asleep on couch so pt preferred I come back later. Tried again at 11:37 with pt having a rash all over her trunk and peri area and itching profusely, had benadryl ~ 45 minutes earlier. PT contacted MD and MD ordered IV benedryl. Attempted 3rd time 13:49 with pt in recliner and having worked with PT but wanting to wait until later due to reporting her BP was up after coming back from working with PT (no elevated BP noted as of yet in chart). Made pt aware we would wait and see her tomorrow since she was not going home today and this was my 3rd try to see her today--she was in agreement (husband in room also).  [address], OTR/L Acute Rehab Services Pager [phone] Office [phone]    Almon Register 04/06/2021, 1:53 PM

## 2021-04-06 NOTE — Progress Notes (Signed)
Physical Therapy Treatment Patient Details Name: Carol [name] MRN: [id number] DOB: [birth year] Today's Date: 04/06/2021   History of Present Illness Carol [name] is a 50 y.o. female with medical history significant of basal cell carcinoma, depression, anxiety, who presented to the ER after she jumped off of the monkey bars landing on the wrong side injuring her ankle. X-ray:Comminuted transverse fracture of the distal tibial metaphysis and mildly comminuted transverse distal fibular metaphysis fracture. Pt s/p ORIF of distal tibia/fibula.    PT Comments    Continuing work on functional mobility and activity tolerance;  session focused on further gait training, and initaiting crutch and stair training; Overall progressing well; Husband present for latter part of session   Recommendations for follow up therapy are one component of a multi-disciplinary discharge planning process, led by the attending physician.  Recommendations may be updated based on patient status, additional functional criteria and insurance authorization.  Follow Up Recommendations  Home health PT;Supervision/Assistance - 24 hour;Other (comment)     Equipment Recommendations  Rolling walker with 5" wheels;Crutches;3in1 (PT)    Recommendations for Other Services       Precautions / Restrictions Precautions Precautions: Fall Restrictions LLE Weight Bearing: Non weight bearing     Mobility  Bed Mobility               General bed mobility comments: EOB upon entry    Transfers Overall transfer level: Needs assistance Equipment used: Crutches;Rolling walker (2 wheeled) Transfers: Sit to/from Stand Sit to Stand: Min guard         General transfer comment: cues for hand placement, crutch management, and to maintian NWB LLE; overall good rise  Ambulation/Gait Ambulation/Gait assistance: Min guard Gait Distance (Feet): 20 Feet Assistive device: Crutches Gait Pattern/deviations: Step-through pattern      General Gait Details: verbal and demo cues for crutch use; small, step-to steps initially; progressed to step-through; pt indicated more fatigued with use of crutches   Stairs Stairs: Yes Stairs assistance: Min guard Stair Management: One rail Left;Forwards;With crutches Number of Stairs: 3 General stair comments: went up and down 3 steps with crutch R hand and rail L hand; also practiced ascending one steps with rW backwards   Wheelchair Mobility    Modified Rankin (Stroke Patients Only)       Balance     Sitting balance-[name]: Good       Standing balance-[name]: Poor                              Cognition Arousal/Alertness: Awake/alert Behavior During Therapy: WFL for tasks assessed/performed (though a bit distractible) Overall Cognitive Status: Within Functional Limits for tasks assessed                                 General Comments: Itch rash leading to internal distraction      Exercises      General Comments General comments (skin integrity, edema, etc.): husband present toward the end of session, and asked questions about managing steps; we discussed using a shower chair for a rest break when she eventually may need to go up a flight; discussed bumping up as well      Pertinent Vitals/Pain Pain Assessment: Faces Faces Pain Scale: Hurts even more Pain Location: LLE Pain Descriptors / Indicators: Aching;Sore Pain Intervention(s): Monitored during session    Home Living  Prior Function            PT Goals (current goals can now be found in the care plan section) Acute Rehab PT Goals Patient Stated Goal: to go home tomorrow PT Goal Formulation: With patient Time For Goal Achievement: 04/12/21 Potential to Achieve Goals: Good Progress towards PT goals: Progressing toward goals    Frequency    Min 5X/week      PT Plan Current plan remains appropriate    Co-evaluation               AM-PAC PT "6 Clicks" Mobility   Outcome Measure  Help needed turning from your back to your side while in a flat bed without using bedrails?: None Help needed moving from lying on your back to sitting on the side of a flat bed without using bedrails?: A Little Help needed moving to and from a bed to a chair (including a wheelchair)?: A Little Help needed standing up from a chair using your arms (e.g., wheelchair or bedside chair)?: A Little Help needed to walk in hospital room?: A Little Help needed climbing 3-5 steps with a railing? : A Little 6 Click Score: 19    End of Session Equipment Utilized During Treatment: Gait belt Activity Tolerance: Patient tolerated treatment well Patient left: in chair;with call bell/phone within reach;with chair alarm set Nurse Communication: Mobility status PT Visit Diagnosis: Other abnormalities of gait and mobility (R26.89);Pain Pain - Right/Left: Left Pain - part of body: Leg     Time: 1235-1311 PT Time Calculation (min) (ACUTE ONLY): 36 min  Charges:  $Gait Training: 23-37 mins                     Roney Marion, [place]  [place] Pager [phone] Office [phone]    Colletta [place] 04/06/2021, 4:10 PM

## 2021-04-06 NOTE — TOC Initial Note (Signed)
Transition of Care [facility]) - Initial/Assessment Note    Patient Details  Name: Carol [name] MRN: [id number] Date of Birth: 14-Dec-1970  [facility] CM/SW Contact:    Marilu Favre, RN Phone Number: 04/06/2021, 11:28 AM  Clinical Narrative:                 Patient from home with husband.   Daughter has ordered tub bench on [place].   NCM ordered walker and 3 in 1 through [facility]. DME will come to hospital room.  Tommi Rumps with [facility] accepted [facility] referral  Expected Discharge Plan: [place] Barriers to Discharge: No Barriers Identified   Patient Goals and CMS Choice Patient states their goals for this hospitalization and ongoing recovery are:: to go home CMS Medicare.gov Compare Post Acute Care list provided to:: Patient Choice offered to / list presented to : Patient, Spouse  Expected Discharge Plan and Services Expected Discharge Plan: [place] Acute Care Choice: Home Health, Durable Medical Equipment Living arrangements for the past 2 months: Single Family Home Expected Discharge Date: 04/06/21               DME Arranged: 3-N-1Gilford Rile rolling   Date DME Agency Contacted: 04/06/21 Time DME Agency Contacted: 1127 Representative spoke with at DME Agency: Freda Munro HH Arranged: PT, OT [place] Agency: [place] Date [place]: 04/06/21 Time [place]: 1127 Representative spoke with at [place]: [place] Arrangements/Services Living arrangements for the past 2 months: [place] Lives with:: Spouse Patient language and need for interpreter reviewed:: Yes Do you feel safe going back to the place where you live?: Yes      Need for Family Participation in Patient Care: Yes (Comment) Care giver support system in place?: Yes (comment)   Criminal Activity/Legal Involvement Pertinent to Current Situation/Hospitalization: No - Comment as needed  Activities of Daily  Living Home Assistive Devices/Equipment: Eyeglasses, Contact lenses ADL Screening (condition at time of admission) Patient's cognitive ability adequate to safely complete daily activities?: Yes Is the patient deaf or have difficulty hearing?: No Does the patient have difficulty seeing, even when wearing glasses/contacts?: No Does the patient have difficulty concentrating, remembering, or making decisions?: No Patient able to express need for assistance with ADLs?: Yes Does the patient have difficulty dressing or bathing?: Yes Independently performs ADLs?: No Communication: Independent Dressing (OT): Needs assistance Is this a change from baseline?: Change from baseline, expected to last >3 days Grooming: Independent Feeding: Independent Bathing: Needs assistance Is this a change from baseline?: Change from baseline, expected to last >3 days Toileting: Needs assistance Is this a change from baseline?: Change from baseline, expected to last >3days In/Out Bed: Needs assistance Is this a change from baseline?: Change from baseline, expected to last >3 days Walks in Home: Needs assistance Is this a change from baseline?: Change from baseline, expected to last >3 days Does the patient have difficulty walking or climbing stairs?: Yes Weakness of Legs: Left Weakness of Arms/Hands: None  Permission Sought/Granted   Permission granted to share information with : Yes, Verbal Permission Granted  Share Information with NAME: spouse Carol [name]           Emotional Assessment Appearance:: Appears stated age Attitude/Demeanor/Rapport: Engaged Affect (typically observed): Accepting Orientation: : Oriented to Self, Oriented to Place, Oriented to  Time, Oriented to Situation Alcohol / Substance Use: Not Applicable Psych Involvement: No (comment)  Admission diagnosis:  Closed left ankle fracture [S82.892A] Patient Active Problem List   Diagnosis Date Noted   Closed left ankle fracture 04/02/2021    Anxiety 06/18/2020   Gastroesophageal reflux disease 06/18/2020   Hypercholesterolemia 06/18/2020   Restless legs syndrome 06/18/2020   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

## 2021-04-07 ENCOUNTER — Encounter (HOSPITAL_COMMUNITY): Payer: Self-pay | Admitting: Orthopaedic Surgery

## 2021-04-07 DIAGNOSIS — S82892A Other fracture of left lower leg, initial encounter for closed fracture: Secondary | ICD-10-CM | POA: Diagnosis not present

## 2021-04-07 MED ORDER — FAMOTIDINE IN NACL 20-0.9 MG/50ML-% IV SOLN
20.0000 mg | INTRAVENOUS | Status: DC
  Filled 2021-04-07: qty 50

## 2021-04-07 MED ORDER — HYDROMORPHONE HCL 1 MG/ML IJ SOLN: 0.5000 mg | Freq: Four times a day (QID) | INTRAMUSCULAR | Status: DC | PRN

## 2021-04-07 MED ORDER — BISACODYL 10 MG RE SUPP
10.0000 mg | Freq: Once | RECTAL | Status: DC
  Filled 2021-04-07: qty 1

## 2021-04-07 MED ORDER — CAMPHOR-MENTHOL 0.5-0.5 % EX LOTN
TOPICAL_LOTION | CUTANEOUS | Status: DC | PRN
  Filled 2021-04-07: qty 222

## 2021-04-07 MED ORDER — CITALOPRAM HYDROBROMIDE 20 MG PO TABS
30.0000 mg | ORAL_TABLET | ORAL | Status: DC
  Administered 2021-04-07: 30 mg via ORAL
  Filled 2021-04-07: qty 2

## 2021-04-07 MED ORDER — OXYCODONE-ACETAMINOPHEN 7.5-325 MG PO TABS
2.0000 | ORAL_TABLET | ORAL | Status: DC | PRN
  Administered 2021-04-07: 2 via ORAL
  Filled 2021-04-07 (×2): qty 2

## 2021-04-07 MED ORDER — FAMOTIDINE IN NACL 20-0.9 MG/50ML-% IV SOLN
20.0000 mg | Freq: Once | INTRAVENOUS | Status: AC
  Administered 2021-04-07: 20 mg via INTRAVENOUS
  Filled 2021-04-07: qty 50

## 2021-04-07 MED ORDER — FAMOTIDINE 20 MG PO TABS
20.0000 mg | ORAL_TABLET | Freq: Two times a day (BID) | ORAL | Status: DC
  Administered 2021-04-07 – 2021-04-08 (×2): 20 mg via ORAL
  Filled 2021-04-07 (×2): qty 1

## 2021-04-07 MED ORDER — DIPHENHYDRAMINE HCL 50 MG/ML IJ SOLN
12.5000 mg | Freq: Two times a day (BID) | INTRAMUSCULAR | Status: DC
  Administered 2021-04-07: 12.5 mg via INTRAVENOUS
  Filled 2021-04-07: qty 1

## 2021-04-07 MED ORDER — CAMPHOR-MENTHOL 0.5-0.5 % EX LOTN: TOPICAL_LOTION | CUTANEOUS | 0 refills | Status: AC | PRN

## 2021-04-07 MED ORDER — ACETAMINOPHEN 500 MG PO TABS
1000.0000 mg | ORAL_TABLET | Freq: Three times a day (TID) | ORAL | Status: DC
  Administered 2021-04-07 – 2021-04-08 (×2): 1000 mg via ORAL
  Filled 2021-04-07 (×4): qty 2

## 2021-04-07 MED ORDER — LORATADINE 10 MG PO TABS
10.0000 mg | ORAL_TABLET | Freq: Every day | ORAL | Status: DC
  Administered 2021-04-07 – 2021-04-08 (×2): 10 mg via ORAL
  Filled 2021-04-07 (×2): qty 1

## 2021-04-07 MED ORDER — NALOXONE HCL 0.4 MG/ML IJ SOLN: 0.4000 mg | INTRAMUSCULAR | Status: DC | PRN

## 2021-04-07 MED ORDER — MORPHINE SULFATE 15 MG PO TABS
15.0000 mg | ORAL_TABLET | ORAL | Status: DC | PRN
  Administered 2021-04-07 – 2021-04-08 (×4): 15 mg via ORAL
  Filled 2021-04-07 (×4): qty 1

## 2021-04-07 MED ORDER — SENNOSIDES-DOCUSATE SODIUM 8.6-50 MG PO TABS
2.0000 | ORAL_TABLET | Freq: Every day | ORAL | Status: DC
  Administered 2021-04-07: 2 via ORAL
  Filled 2021-04-07 (×2): qty 2

## 2021-04-07 NOTE — Progress Notes (Signed)
Physical Therapy Treatment Patient Details Name: Carol [name] MRN: [id number] DOB: Nov 03, 1970 Today's Date: 04/07/2021   History of Present Illness Carol [name] is a 50 y.o. female with medical history significant of basal cell carcinoma, depression, anxiety, who presented to the ER after she jumped off of the monkey bars landing on the wrong side injuring her ankle. X-ray:Comminuted transverse fracture of the distal tibial metaphysis and mildly comminuted transverse distal fibular metaphysis fracture. Pt s/p ORIF of distal tibia/fibula.    PT Comments    Continuing work on functional mobility and activity tolerance;  Session focused on more gait trainign with crutches and with RW; very good progress with crutches, step-through pattern emerging; solid on RW; Pt states the hardest part is holding her leg up to maintain NWB; Good progress, and will have assist at home; OK for dc home from PT standpoint   Recommendations for follow up therapy are one component of a multi-disciplinary discharge planning process, led by the attending physician.  Recommendations may be updated based on patient status, additional functional criteria and insurance authorization.  Follow Up Recommendations  Home health PT;Supervision/Assistance - 24 hour;Other (comment) will likely progress well enough to not need HHPT-- will discuss with pt next session     Equipment Recommendations  Rolling walker with 5" wheels;Crutches;3in1 (PT)    Recommendations for Other Services       Precautions / Restrictions Precautions Precautions: Fall Precaution Comments: Fall risk greatly reduced with use of rW Restrictions Weight Bearing Restrictions: Yes LLE Weight Bearing: Non weight bearing     Mobility  Bed Mobility Overal bed mobility: Needs Assistance Bed Mobility: Supine to Sit     Supine to sit: Min guard     General bed mobility comments: Noting better able to manage LLE coming on and off the bed     Transfers Overall transfer level: Needs assistance Equipment used: Crutches;Rolling walker (2 wheeled) Transfers: Sit to/from Stand Sit to Stand: Supervision         General transfer comment: Cues for crutch management; overall managing well  Ambulation/Gait Ambulation/Gait assistance: Min guard (with and without physical contact) Gait Distance (Feet): 140 Feet (with one seted rest break) Assistive device: Crutches;Rolling walker (2 wheeled) Gait Pattern/deviations: Step-through pattern Gait velocity: slwoed   General Gait Details: Walked with crutches out into the hallway, took a seated rest break, tehn with RW back into room; Noting better balance with crutches with practice; Pt states the most difficult part of walking is holding her LLE up to keep NWB   Stairs             Wheelchair Mobility    Modified Rankin (Stroke Patients Only)       Balance     Sitting balance-[name]: Good       Standing balance-[name]: Poor (approaching Fair)                              Cognition Arousal/Alertness: Awake/alert Behavior During Therapy: WFL for tasks assessed/performed Overall Cognitive Status: Within Functional Limits for tasks assessed                                        Exercises      General Comments        Pertinent Vitals/Pain Pain Assessment: Faces Faces Pain Scale: Hurts little more Pain Location:  LLE Pain Descriptors / Indicators: Aching;Sore Pain Intervention(s): Monitored during session;Premedicated before session    Home Living                      Prior Function            PT Goals (current goals can now be found in the care plan section) Acute Rehab PT Goals Patient Stated Goal: Did not state; but agreeable to amb PT Goal Formulation: With patient Time For Goal Achievement: 04/12/21 Potential to Achieve Goals: Good Progress towards PT goals: Progressing toward goals     Frequency    Min 5X/week      PT Plan Current plan remains appropriate    Co-evaluation              AM-PAC PT "6 Clicks" Mobility   Outcome Measure  Help needed turning from your back to your side while in a flat bed without using bedrails?: None Help needed moving from lying on your back to sitting on the side of a flat bed without using bedrails?: A Little Help needed moving to and from a bed to a chair (including a wheelchair)?: A Little Help needed standing up from a chair using your arms (e.g., wheelchair or bedside chair)?: A Little Help needed to walk in hospital room?: A Little Help needed climbing 3-5 steps with a railing? : A Little 6 Click Score: 19    End of Session Equipment Utilized During Treatment: Gait belt Activity Tolerance: Patient tolerated treatment well Patient left: in bed;with call bell/phone within reach Nurse Communication: Mobility status PT Visit Diagnosis: Other abnormalities of gait and mobility (R26.89);Pain Pain - Right/Left: Left Pain - part of body: Leg     Time: 0927-0949 PT Time Calculation (min) (ACUTE ONLY): 22 min  Charges:  $Gait Training: 8-22 mins                     Roney Marion, PT  [place] Pager [phone] Office [phone]    Colletta [place] 04/07/2021, 2:23 PM

## 2021-04-07 NOTE — Progress Notes (Signed)
Writer called patient's dermatologist office, Dr. Myrtie Hawk at [facility] at [phone], extension #[phone].  Spoke with her scheduler, Ms. Christy to schedule an appointment as soon as possible.  Alyse Low stated that she will contact Dr. Marcello Moores via phone and have it arranged.  Alyse Low will call the patient to let her know when her appointment is scheduled.

## 2021-04-07 NOTE — Progress Notes (Signed)
Occupational Therapy Treatment Patient Details Name: Carol [name] MRN: [id number] DOB: December 29, 1970 Today's Date: 04/07/2021   History of present illness Carol [name] is a 50 y.o. female with medical history significant of basal cell carcinoma, depression, anxiety, who presented to the ER after she jumped off of the monkey bars landing on the wrong side injuring her ankle. X-ray:Comminuted transverse fracture of the distal tibial metaphysis and mildly comminuted transverse distal fibular metaphysis fracture. Pt s/p ORIF of distal tibia/fibula.   OT comments  Pt is progressing towards OT goals. Pt educated in AE use and compensatory strategies for ADLs, demonstrating good learning. Pt requiring setup A for dressing and Mod I with tub transfers. Pt should be safe to d/c home once medically cleared. Will continue to follow acutely.   Recommendations for follow up therapy are one component of a multi-disciplinary discharge planning process, led by the attending physician.  Recommendations may be updated based on patient status, additional functional criteria and insurance authorization.    Follow Up Recommendations  No OT follow up;Supervision - Intermittent    Equipment Recommendations  3 in 1 bedside commode    Recommendations for Other Services      Precautions / Restrictions Precautions Precautions: Fall Precaution Comments: Fall risk greatly reduced with use of rW Restrictions Weight Bearing Restrictions: Yes LLE Weight Bearing: Non weight bearing       Mobility Bed Mobility Overal bed mobility: Needs Assistance Bed Mobility: Supine to Sit     Supine to sit: Min guard         Transfers     Balance Overall balance assessment: Needs assistance Sitting-balance support: No upper extremity supported;Feet supported Sitting balance-[name]: Good     Standing balance support: Bilateral upper extremity supported Standing balance-[name]: Poor                              ADL either performed or assessed with clinical judgement   ADL Overall ADL's : Needs assistance/impaired                 Upper Body Dressing : Set up;Sitting   Lower Body Dressing: Set up;With adaptive equipment;Sitting/lateral leans           Tub/ Shower Transfer: Modified independent;Tub bench;Rolling walker   Functional mobility during ADLs: Supervision/safety;Rolling walker General ADL Comments: Pt educated in use of reacher to get shorts over splint during LB dressing, however with more practice pt may not need device. Pt educated in use of tub bench and able to perform tub transfers with Mod I using device after education.     Vision       Perception     Praxis      Cognition Arousal/Alertness: Awake/alert Behavior During Therapy: WFL for tasks assessed/performed Overall Cognitive Status: Within Functional Limits for tasks assessed                                          Exercises     Shoulder Instructions       General Comments      Pertinent Vitals/ Pain       Pain Assessment: Faces Faces Pain Scale: Hurts a little bit Pain Location: LLE Pain Descriptors / Indicators: Aching;Sore Pain Intervention(s): Monitored during session;Repositioned  Home Living  Prior Functioning/Environment              Frequency  Min 2X/week        Progress Toward Goals  OT Goals(current goals can now be found in the care plan section)  Progress towards OT goals: Progressing toward goals  Acute Rehab OT Goals Patient Stated Goal: return home OT Goal Formulation: With patient Time For Goal Achievement: 04/19/21 Potential to Achieve Goals: Good ADL Goals Pt Will Perform Lower Body Dressing: with adaptive equipment;sit to/from stand;with modified independence Pt Will Transfer to Toilet: with modified independence;ambulating;bedside commode Pt Will Perform  Tub/Shower Transfer: Tub transfer;with min assist;ambulating;tub bench;rolling walker Additional ADL Goal #1: Pt will be Mod I in and OOB for basic ADLs (HOB flat, no rail, increased time)  Plan Discharge plan remains appropriate;Frequency remains appropriate    Co-evaluation                 AM-PAC OT "6 Clicks" Daily Activity     Outcome Measure   Help from another person eating meals?: None Help from another person taking care of personal grooming?: A Little Help from another person toileting, which includes using toliet, bedpan, or urinal?: A Little Help from another person bathing (including washing, rinsing, drying)?: A Little Help from another person to put on and taking off regular upper body clothing?: A Little Help from another person to put on and taking off regular lower body clothing?: A Little 6 Click Score: 19    End of Session Equipment Utilized During Treatment: Rolling walker  OT Visit Diagnosis: Unsteadiness on feet (R26.81);Other abnormalities of gait and mobility (R26.89);Pain;Muscle weakness (generalized) (M62.81) Pain - Right/Left: Left Pain - part of body: Ankle and joints of foot   Activity Tolerance Patient tolerated treatment well   Patient Left in bed;with call bell/phone within reach;with family/visitor present   Nurse Communication Mobility status        Time: 1751-0258 OT Time Calculation (min): 36 min  Charges: OT General Charges $OT Visit: 1 Visit OT Treatments $Self Care/Home Management : 23-37 mins  Carol [name] C, OT/L  Acute Rehab [place] 04/07/2021, 3:54 PM

## 2021-04-07 NOTE — Progress Notes (Signed)
Progress note:  Carol [name] XBJ:[id number] DOB: [birth year]  PCP: Leighton Ruff, MD  Admit date: 04/02/2021 Discharge date: 04/07/2021  Time spent: 35 minutes.  Recommendations for Outpatient Follow-up:  Follow-up with orthopedic surgery Follow-up with your primary care provider Continue PT OT with assistance and fall precautions.  Discharge Diagnoses:  Active Hospital Problems   Diagnosis Date Noted   Closed left ankle fracture 04/02/2021   Restless legs syndrome 06/18/2020   Hypercholesterolemia 06/18/2020   Gastroesophageal reflux disease 06/18/2020   Anxiety 06/18/2020   Grover's disease 09/15/2015   Seborrheic keratosis 01/25/2012    Resolved Hospital Problems  No resolved problems to display.    Discharge Condition: Stable  Diet recommendation: Resume previous diet.  Vitals:   04/07/21 0802 04/07/21 1337  BP: 116/77 99/70  Pulse: 84 73  Resp: 17 17  Temp: 98.9 F (37.2 C) 98.2 F (36.8 C)  SpO2: 97% 96%    History of present illness:  50 year old female  with medical history significant of GERD, hyperlipidemia, chronic anxiety/depression who presented to [facility] ED after she fell off of the monkey bars, experienced immediate pain and deformity of the left ankle and was unable to ambulate.  In the emergency room imaging studies demonstrated a distal tib-fib fracture.  She was given conscious sedation and a gentle manipulation maneuver was performed by the emergency room staff and a splint was applied.  She was seen by orthopedic surgery, post open reduction internal fixation TBI/fibular fracture on 04/04/2021 by Dr. Kathaleen Bury.  Recommended strict nonweightbearing to left lower extremity and maximum elevation.  To maintain short leg splint left lower extremity until follow-up.  Aspirin 325 mg daily x30 days for DVT prophylaxis.  Physical therapy to assist with gait and nonweightbearing restrictions.  Sutures out in 2 to 3 weeks with exchange of short leg splint to short  leg cast in outpatient office.  She was evaluated by PT OT on 04/05/2021 with recommendation for home health PT.  Hospital Course Complicated by Possible medication allergy causing skin rash for which pharmacy was consulted to assist with identification of possible medication and treatment of her skin rash.   04/07/2021: Pain not well controlled 7 out of 10, pain medications adjusted.  Rash is persistent.  Pharmacy is assisting, clindamycin might be the cause, still unclear.  Hospital Course:  Principal Problem:   Closed left ankle fracture Active Problems:   Anxiety   Gastroesophageal reflux disease   Grover's disease   Hypercholesterolemia   Seborrheic keratosis   Restless legs syndrome  POD #3 post ORIF left ankle fracture post mechanical fall, on 04/04/2021 by orthopedic surgery, Dr. Kathaleen Bury. Distal tibiofibular.   Received IV clindamycin and IV fluid perioperatively. Per orthopedic surgery no weightbearing restriction.  Sutures out in 2 to 3 weeks with exchange of short leg splint to a short leg cast in outpatient office.  Full dose aspirin 325 mg daily x30 days for DVT prophylaxis. Seen by PT OT, PT recommended home health PT.   DME 3 and 1 bedside commode, DME rolling walker, DME tub bench. Follow-up with orthopedic surgery.  Possible medication allergy causing skin rash  Pharmacy consulted to assist with identification of possible medication and treatment of her skin rash. Clindamycin might be the cause, still unclear. Appreciate pharmacy's assistance   Improving, acute blood loss anemia postsurgery Baseline hemoglobin 11.4 Hemoglobin Trending, 9.6 from 8.7 Follow-up with your PCP.   Chronic anxiety disorder: Continue home Celexa.   Seborrheic keratosis: Recommend to follow up with your PCP  or dermatology.  In the hospital received Sarna prn for dry skin.  Skin rash, diffuse Patient has several allergies and skin conditions unclear if it is from pain medication or  antibiotics.  Treat symptomatically and avoid causing agents  Constipation likely opiate induced Senokot 2 tablets daily Dulcolax suppository as needed     DVT prophylaxis: Aspirin 325 mg daily x30 days.  Code Status: Full code    Procedures:  ORIF left ankle fracture post mechanical fall on 04/04/2021 by orthopedic surgery, Dr. Kathaleen Bury.  Consultations: Orthopedic surgery  Discharge Exam: BP 99/70 (BP Location: Right Arm)   Pulse 73   Temp 98.2 F (36.8 C) (Oral)   Resp 17   Ht 5\' 1"  (1.549 m)   Wt 49.4 kg   LMP 07/31/2018   SpO2 96%   BMI 20.60 kg/m  General: 50 y.o. year-old female well-developed well-nourished in no acute distress.  She is alert and oriented x3. Cardiovascular: Regular rate and rhythm no rubs or gallops.  Respiratory: Clear auscultation with no wheezes or rales.  Abdomen: Soft nontender no bowel sounds present.  Sounds x4 quadrants. Musculoskeletal: Left lower extremity in splint.  Skin:      Right armpit 04/07/21       Left armpit 04/07/21   R leg 04/07/21  Psychiatry: Mood is appropriate for condition and setting.  Discharge Instructions You were cared for by a hospitalist during your hospital stay. If you have any questions about your discharge medications or the care you received while you were in the hospital after you are discharged, you can call the unit and asked to speak with the hospitalist on call if the hospitalist that took care of you is not available. Once you are discharged, your primary care physician will handle any further medical issues. Please note that NO REFILLS for any discharge medications will be authorized once you are discharged, as it is imperative that you return to your primary care physician (or establish a relationship with a primary care physician if you do not have one) for your aftercare needs so that they can reassess your need for medications and monitor your lab values.   Allergies as of 04/07/2021        Reactions   Doxycycline Rash   Keflex [cephalexin] Rash   Minocycline Rash   Sulfa Antibiotics Rash   Other reaction(s): Other (See Comments), Other (See Comments)   Sulfacetamide Sodium Rash        Medication List     TAKE these medications    aspirin 325 MG tablet Take 1 tablet (325 mg total) by mouth daily.   camphor-menthol lotion Commonly known as: SARNA Apply topically as needed for itching.   citalopram 20 MG tablet Commonly known as: CELEXA Take 20 mg by mouth every other day.   citalopram 40 MG tablet Commonly known as: CELEXA Take 30 mg by mouth every other day.   fluticasone 50 MCG/ACT nasal spray Commonly known as: FLONASE Place 1 spray into both nostrils daily as needed for allergies.   HAIR SKIN & NAILS ADVANCED PO Take 1 tablet by mouth daily.   multivitamin with minerals Tabs tablet Take 1 tablet by mouth daily.   oxyCODONE-acetaminophen 7.5-325 MG tablet Commonly known as: PERCOCET Take 1-2 tablets by mouth every 8 (eight) hours as needed for up to 5 days for severe pain.   scopolamine 1 MG/3DAYS Commonly known as: TRANSDERM-SCOP Place 1 patch onto the skin every 3 (three) days.   terbinafine 250 MG  tablet Commonly known as: LamISIL Take 1 tablet (250 mg total) by mouth daily.               Durable Medical Equipment  (From admission, onward)           Start     Ordered   04/07/21 0713  For home use only DME Crutches  Once        04/07/21 8242   04/06/21 1125  For home use only DME Walker rolling  Once       Question Answer Comment  Walker: With [employer] Whe[employer]   Patient needs a walker to treat with the following condition Weakness      04/06/21 1124   04/05/21 1845  For home use only DME Tub bench  Once        04/05/21 1844   04/05/21 1243  For home use only DME 3 n 1  Once        04/05/21 1242           Allergies  Allergen Reactions   Doxycycline Rash   Keflex [Cephalexin] Rash   Minocycline Rash    Sulfa Antibiotics Rash    Other reaction(s): Other (See Comments), Other (See Comments)   Sulfacetamide Sodium Rash    Follow-up Information     Leighton Ruff, MD. Call in 1 day(s).   Specialty: Family Medicine Why: Please call for a posthospital follow-up appointment. Contact information: [place] [address] [phone]         Armond Hang, MD. Call in 1 day(s).   Specialty: Orthopedic Surgery Why: Please call for a posthospital follow-up appointment. Contact information: [address] [place] [address] [phone]         [facility] Follow up.   Specialty: Home Health Services Contact information: [address] [place] [address] [phone]         [facility] Follow up.   Why: Company walker and 3 in 1 was ordered through Contact information: [address] [place] [address] [phone]         Audrie Lia, MD. Call today.   Specialty: Dermatology                 The results of significant diagnostics from this hospitalization (including imaging, microbiology, ancillary and laboratory) are listed below for reference.    Significant Diagnostic Studies: DG Tibia/Fibula Left  Result Date: 04/02/2021 CLINICAL DATA:  Fall. Pain is at ankle in just above the ankle. Pulses intact. EXAM: LEFT ANKLE COMPLETE - 3+ VIEW; LEFT TIBIA AND FIBULA - 2 VIEW COMPARISON:  None. FINDINGS: Overlying splint material obscures fine osseous detail. Comminuted transverse fracture of the distal tibia. The tibial plafond appears intact, however there is probable fracture extension into the distal tibiofibular joint. The distal fracture fragment is displaced laterally approximately 1.4 cm with mild medial apex angulation. No significant impaction or distraction. Comminuted transverse fracture of the distal fibula at the level of the distal tibiofibular joint. The distal  fracture fragment is displaced approximately 1 cm laterally with 0.9 cm of impaction. Mild medial apex angulation. Talar dome is intact. No ankle or knee joint dislocation. No other fracture in the left lower leg. IMPRESSION: 1. Comminuted mildly displaced transverse fracture of the distal tibia, extending to the distal tibiofibular joint. 2. Comminuted mildly displaced transverse fracture of the distal fibula at the level of the distal tibiofibular joint with  mild impaction. 3. No ankle or knee joint dislocation. Electronically Signed   By: Ileana Roup M.D.   On: 04/02/2021 16:58   DG Ankle Complete Left  Result Date: 04/04/2021 CLINICAL DATA:  ORIF EXAM: LEFT ANKLE COMPLETE - 3+ VIEW COMPARISON:  04/02/2021 FINDINGS: 8 fluoroscopic images are obtained during the performance of the procedure and are provided for interpretation only. Images demonstrate plate and screw fixations across the distal fibular and tibial fractures, with near anatomic alignment. Please refer to the operative report. FLUOROSCOPY TIME:  4 minutes 32 seconds IMPRESSION: 1. ORIF left ankle. Electronically Signed   By: Randa Ngo M.D.   On: 04/04/2021 15:25   DG Ankle Complete Left  Result Date: 04/02/2021 CLINICAL DATA:  Post reduction EXAM: LEFT ANKLE COMPLETE - 3+ VIEW COMPARISON:  04/02/2021 FINDINGS: Comminuted distal tibial fracture with residual 1/4 shaft diameter lateral and just under 1/4 shaft diameter anterior displacement of distal fracture fragment. Mild lateral angulation which is slightly decreased compared to prior. Acute comminuted distal fibular fracture with residual 1/2 shaft diameter lateral displacement. Residual mild lateral angulation of distal fracture fragment, also decreased compared to prior. Mortise appears symmetric. IMPRESSION: Acute comminuted distal tibial and fibular fractures with residual displacement and angulation, though decreased compared to prior Electronically Signed   By: Donavan Foil M.D.    On: 04/02/2021 18:18   DG Ankle Complete Left  Result Date: 04/02/2021 CLINICAL DATA:  Fall. Pain is at ankle in just above the ankle. Pulses intact. EXAM: LEFT ANKLE COMPLETE - 3+ VIEW; LEFT TIBIA AND FIBULA - 2 VIEW COMPARISON:  None. FINDINGS: Overlying splint material obscures fine osseous detail. Comminuted transverse fracture of the distal tibia. The tibial plafond appears intact, however there is probable fracture extension into the distal tibiofibular joint. The distal fracture fragment is displaced laterally approximately 1.4 cm with mild medial apex angulation. No significant impaction or distraction. Comminuted transverse fracture of the distal fibula at the level of the distal tibiofibular joint. The distal fracture fragment is displaced approximately 1 cm laterally with 0.9 cm of impaction. Mild medial apex angulation. Talar dome is intact. No ankle or knee joint dislocation. No other fracture in the left lower leg. IMPRESSION: 1. Comminuted mildly displaced transverse fracture of the distal tibia, extending to the distal tibiofibular joint. 2. Comminuted mildly displaced transverse fracture of the distal fibula at the level of the distal tibiofibular joint with mild impaction. 3. No ankle or knee joint dislocation. Electronically Signed   By: Ileana Roup M.D.   On: 04/02/2021 16:58   CT ANKLE LEFT WO CONTRAST  Result Date: 04/03/2021 CLINICAL DATA:  Distal tibial and fibular fractures. EXAM: CT OF THE LEFT ANKLE WITHOUT CONTRAST TECHNIQUE: Multidetector CT imaging of the left ankle was performed according to the standard protocol. Multiplanar CT image reconstructions were also generated. COMPARISON:  None. FINDINGS: Bones/Joint/Cartilage Comminuted transverse fracture of the distal tibial metaphysis with 7 mm of lateral displacement and 4 mm of anterior displacement. Mildly comminuted transverse distal fibular metaphysis fracture with 8 mm of lateral displacement and 5 mm of anterior  displacement. No other fracture or dislocation. No aggressive osseous lesion. Ankle mortise is intact. No joint effusion. Ligaments Ligaments are suboptimally evaluated by CT. Muscles and Tendons Flexor, extensor, peroneal and Achilles tendons are grossly intact. Plantar fascia is grossly intact. Soft tissue No fluid collection or hematoma. No soft tissue mass. Soft tissue swelling over the medial and lateral aspect of the ankle. IMPRESSION: 1. Comminuted transverse fracture of  the distal tibial metaphysis with 7 mm of lateral displacement and 4 mm of anterior displacement. 2. Mildly comminuted transverse distal fibular metaphysis fracture with 8 mm of lateral displacement and 5 mm of anterior displacement. Electronically Signed   By: Kathreen Devoid M.D.   On: 04/03/2021 18:48   DG C-Arm 1-60 Min-No Report  Result Date: 04/04/2021 Fluoroscopy was utilized by the requesting physician.  No radiographic interpretation.   DG C-Arm 1-60 Min-No Report  Result Date: 04/04/2021 Fluoroscopy was utilized by the requesting physician.  No radiographic interpretation.   DG C-Arm 1-60 Min-No Report  Result Date: 04/04/2021 Fluoroscopy was utilized by the requesting physician.  No radiographic interpretation.    Microbiology: Recent Results (from the past 240 hour(s))  Resp Panel by RT-PCR (Flu A&B, Covid) Nasopharyngeal Swab     Status: None   Collection Time: 04/02/21  5:14 PM   Specimen: Nasopharyngeal Swab; Nasopharyngeal(NP) swabs in vial transport medium  Result Value Ref Range Status   SARS Coronavirus 2 by RT PCR NEGATIVE NEGATIVE Final    Comment: (NOTE) SARS-CoV-2 target nucleic acids are NOT DETECTED.  The SARS-CoV-2 RNA is generally detectable in upper respiratory specimens during the acute phase of infection. The lowest concentration of SARS-CoV-2 viral copies this assay can detect is 138 copies/mL. A negative result does not preclude SARS-Cov-2 infection and should not be used as the sole  basis for treatment or other patient management decisions. A negative result may occur with  improper specimen collection/handling, submission of specimen other than nasopharyngeal swab, presence of viral mutation(s) within the areas targeted by this assay, and inadequate number of viral copies(<138 copies/mL). A negative result must be combined with clinical observations, patient history, and epidemiological information. The expected result is Negative.  Fact Sheet for Patients:  EntrepreneurPulse.com.au  Fact Sheet for Healthcare Providers:  IncredibleEmployment.be  This test is no t yet approved or cleared by the [place] FDA and  has been authorized for detection and/or diagnosis of SARS-CoV-2 by FDA under an Emergency Use Authorization (EUA). This EUA will remain  in effect (meaning this test can be used) for the duration of the COVID-19 declaration under Section 564(b)(1) of the Act, 21 U.S.C.section 360bbb-3(b)(1), unless the authorization is terminated  or revoked sooner.       Influenza A by PCR NEGATIVE NEGATIVE Final   Influenza B by PCR NEGATIVE NEGATIVE Final    Comment: (NOTE) The Xpert Xpress SARS-CoV-2/FLU/RSV plus assay is intended as an aid in the diagnosis of influenza from Nasopharyngeal swab specimens and should not be used as a sole basis for treatment. Nasal washings and aspirates are unacceptable for Xpert Xpress SARS-CoV-2/FLU/RSV testing.  Fact Sheet for Patients: EntrepreneurPulse.com.au  Fact Sheet for Healthcare Providers: IncredibleEmployment.be  This test is not yet approved or cleared by the [place] FDA and has been authorized for detection and/or diagnosis of SARS-CoV-2 by FDA under an Emergency Use Authorization (EUA). This EUA will remain in effect (meaning this test can be used) for the duration of the COVID-19 declaration under Section 564(b)(1) of the Act,  21 U.S.C. section 360bbb-3(b)(1), unless the authorization is terminated or revoked.  Performed at [facility], [place] [address], [place], [place] [address]   MRSA Next Gen by PCR, Nasal     Status: None   Collection Time: 04/04/21  8:26 AM   Specimen: Nasal Mucosa; Nasal Swab  Result Value Ref Range Status   MRSA by PCR Next Gen NOT DETECTED NOT DETECTED Final  Comment: (NOTE) The GeneXpert MRSA Assay (FDA approved for NASAL specimens only), is one component of a comprehensive MRSA colonization surveillance program. It is not intended to diagnose MRSA infection nor to guide or monitor treatment for MRSA infections. Test performance is not FDA approved in patients less than 63 years old. Performed at [facility], [place] [address], [place], [place] [address]      Labs: Basic Metabolic Panel: Recent Labs  Lab 04/02/21 1644 04/02/21 2253 04/03/21 0449 04/05/21 0717  NA 139  --  138 137  K 3.9  --  4.5 4.1  CL 109  --  106 103  CO2 23  --  26 28  GLUCOSE 105*  --  126* 112*  BUN 25*  --  19 11  CREATININE 0.85 0.91 0.83 0.93  CALCIUM 8.3*  --  8.7* 8.1*  MG  --   --   --  1.7  PHOS  --   --   --  3.4   Liver Function Tests: Recent Labs  Lab 04/03/21 0449  AST 28  ALT 19  ALKPHOS 47  BILITOT 0.6  PROT 6.5  ALBUMIN 4.0   No results for input(s): LIPASE, AMYLASE in the last 168 hours. No results for input(s): AMMONIA in the last 168 hours. CBC: Recent Labs  Lab 04/02/21 1644 04/02/21 2253 04/03/21 0449 04/05/21 0717 04/06/21 0659  WBC 6.3 8.1 7.5 8.8 7.5  NEUTROABS 4.6  --   --   --   --   HGB 11.1* 11.2* 11.4* 8.7* 9.6*  HCT 33.2* 33.7* 34.8* 26.1* 29.2*  MCV 90.5 91.3 92.3 89.7 91.5  PLT 154 161 154 129* 159   Cardiac Enzymes: No results for input(s): CKTOTAL, CKMB, CKMBINDEX, TROPONINI in the last 168 hours. BNP: BNP (last 3 results) No results for input(s): BNP in the last 8760 hours.  ProBNP (last 3 results) No  results for input(s): PROBNP in the last 8760 hours.  CBG: No results for input(s): GLUCAP in the last 168 hours.     Signed:  Kayleen Memos, MD [facility] 04/07/2021, 1:54 PM

## 2021-04-07 NOTE — Progress Notes (Signed)
Pharmacy Consult for Possible Medication Allergy causing Skin Rash   Patient currently has a rash across her whole back, buttock, groin, trunk and a little on her inner arm. Per patient, the rash started on 10/9 when her back started feeling itchy. She is currently being managed with IV diphenhydramine and Sarna lotion and states it is tolerable and has not worsened since receiving diphenhydramine (first dose 10/9 at 19:30). Patient also recalls feeling some scalp itching after receiving medications on 10/6 when she was at [facility].   Of note, patient has had a rash reaction to doxycycline, cephalexin, minocycline and sulfa drugs. Previous rashes have been limited to her arms and never spread across her torso and trunk like currently. Patient also has a history of basal call carcinoma of the skin, inflamed seborrheic keratitis, actinic keratitis, Grover's disease, and pityrosporum folliculitis, for which she is followed by Dr. Myrtie Hawk, dermatologist at [facility].  Notable medications received:   Fentanyl 10/6- 10/8 Hydromorphone 10/6- 10/9 Methocarbamol 10/6- 10/9 Ketorolac 10/6- 10/9 Clindamycin 10/8 -10/9 Aspirin 10/9 -10/11   Per patient, she does not take any medications other than fluticasone nasal spray PRN, citalopram, multivitamin, and Excedrin migraine (Acetaminophen, aspirin, and caffeine) PRN. She also had a course of terbinafine in September.   Per Care Everywhere, patient received clindamycin lotion in 2016 per her dermatologist's note. Patient does not remember this specific lotion (she was on several others as well) but she did not have a serious rash back then.   Assessment/Plan (Discussed with MD): -If this is a drug-induced rash, clindamycin seems to be the most likely culprit -Suggest seeing her dermatologist as soon as possible for further assessment -Consider loratadine 10mg  daily, famotidine 20mg  BID, acetaminophen 1g TID, diphenhydramine PO 25- 50mg  Q6 hr PRN,  and Sarna lotion PRN itching until rash resolves   Thank you for involving pharmacy in this patient's care.  Benetta Spar, PharmD, BCPS, BCCP Clinical Pharmacist  Please check AMION for all [place] phone numbers After 10:00 PM, call [place] [phone]

## 2021-04-07 NOTE — Progress Notes (Signed)
Subjective: 3 Days Post-Op Procedure(s) (LRB): OPEN REDUCTION INTERNAL FIXATION (ORIF) TIBIA/FIBULA FRACTURE (Left)  Pain now better controlled. Patient has developed a diffuse centripetal erythematous rash that is thought to be a medication allergic reaction. Primary team working this up. Otherwise patient working well with PT/OT without issues.  Objective:   VITALS:  Temp:  [98 F (36.7 C)-98.9 F (37.2 C)] 98.2 F (36.8 C) (10/11 1337) Pulse Rate:  [73-84] 73 (10/11 1337) Resp:  [17-20] 17 (10/11 1337) BP: (99-116)/(68-79) 99/70 (10/11 1337) SpO2:  [94 %-97 %] 96 % (10/11 1337)  LLE: Bulky short leg splint remains in place Neurovascular intact Sensation intact distally Compartment soft No pain with passive stretch Wiggles toes CR<2s  LABS Recent Labs    04/05/21 0717 04/06/21 0659  HGB 8.7* 9.6*  WBC 8.8 7.5  PLT 129* 159   Recent Labs    04/05/21 0717  NA 137  K 4.1  CL 103  CO2 28  BUN 11  CREATININE 0.93  GLUCOSE 112*   No results for input(s): LABPT, INR in the last 72 hours.    Assessment/Plan: 3 Days Post-Op Procedure(s) (LRB): OPEN REDUCTION INTERNAL FIXATION (ORIF) TIBIA/FIBULA FRACTURE (Left)  -doing well now POD3 s/p left distal tibia and fibula ORIF -strict NWB LLE, maximum elevation -maintain short leg splint LLE until follow up -pain meds per primary team -DVT Ppx: Aspirin 325 mg once daily x 30 days (start today 04/05/21) -physical therapy to assist with gait and NWB restrictions -discharge home per primary team -sutures out in 2-3 weeks with exchange of short leg splint to short leg cast in outpatient office (line: [phone])  Armond Hang 04/07/2021, 5:24 PM

## 2021-04-08 DIAGNOSIS — S82892A Other fracture of left lower leg, initial encounter for closed fracture: Secondary | ICD-10-CM | POA: Diagnosis not present

## 2021-04-08 MED ORDER — METHOCARBAMOL 500 MG PO TABS: 500.0000 mg | ORAL_TABLET | Freq: Four times a day (QID) | ORAL | 0 refills | Status: AC | PRN

## 2021-04-08 MED ORDER — DIPHENHYDRAMINE HCL 25 MG PO CAPS
25.0000 mg | ORAL_CAPSULE | ORAL | Status: DC | PRN
  Administered 2021-04-08 (×2): 25 mg via ORAL
  Filled 2021-04-08 (×2): qty 1

## 2021-04-08 MED ORDER — DIPHENHYDRAMINE HCL 25 MG PO CAPS: 25.0000 mg | ORAL_CAPSULE | ORAL | 0 refills | Status: AC | PRN

## 2021-04-08 MED ORDER — ENOXAPARIN SODIUM 40 MG/0.4ML IJ SOSY
40.0000 mg | PREFILLED_SYRINGE | INTRAMUSCULAR | Status: DC
  Administered 2021-04-08: 40 mg via SUBCUTANEOUS
  Filled 2021-04-08: qty 0.4

## 2021-04-08 NOTE — Progress Notes (Signed)
PROGRESS NOTE   Carol [name]  UMP:[id number] DOB: 20-Nov-1970 DOA: 04/02/2021 PCP: Leighton Ruff, MD  Brief Narrative:  50 year old female Right anterior mid tibia 0.5 mm melanoma status post excision, BCC left nasal alla 4/14 Mohs surgery Small cell CA 08/2010 status post excision BCC left neck 11/2009 Mohs surgery prior Grovers disease As well as Pityrospournm Folliculitis in addition   Admitted secondary to ankle injury left ankle jumped off a platform pain right above ankle and at the ankle-seen by orthopedics and underwent on 10/8 left distal tibia/fibula open reduction internal fixation and placement of left short leg splint patient  Patient stabilized but developed a rash during hospitalization quiring medication   Hospital-Problem based course  Ankle injury  Status post surgery 10/8 Dr. Kathaleen Bury Outpatient follow-up as per him Pain control MS IR 15 mg every 4 as needed moderate pain, continue Robaxin 500 every 6 as needed Nonweightbearing left lower extremity maintain splint until follow-up Aspirin 325 daily for DVT prophylaxis and sutures to come out in 2 to 3 weeks as per orthopedics Rash not otherwise specified Received clindamycin x1 on 10/9 Patient has several allergies to various medications including cephalosporin minocycline etc. etc. She is tolerating MS IR to some degree and the rash has not spread We have attempted to call her dermatologist Dr. Myrtie Hawk at [phone] extension # 1 but I received only a voicemail-left Number to discuss the patient's case and see if there is any verbal consult that they will be willing to entertain We do not have the dermatology the hospital system and patient and family expressed frustration at this  I suppose if the rash is controlled with oral medications over the next 24 hours patient may be able to discharge with close follow-up and calling Dr. Marcello Moores Postsurgical blood loss anemia Stable at this time Seborrheic  keratosis Received signout from earlier this admission Depression Continue Celexa 20 mg and 30 mg as per home dosing  DVT prophylaxis: Lovenox Code Status: Full Family Communication: Husband at bedside Disposition:  Status is: Inpatient  Not inpatient appropriate, will call [facility] team and downgrade to OBS.   Dispo:  Patient From:  Home  Planned Disposition:  Home with Health Care Svc  Medically stable for discharge:  Yes          Consultants:  Orthopedics Telephone consulted dermatology  Procedures: No  Antimicrobials:   Clindamycin X1 perioperatively   Subjective: Pain seems controlled Still requiring occasional IV medication for symptom control of rash   Objective: Vitals:   04/07/21 0802 04/07/21 1337 04/07/21 2108 04/08/21 0717  BP: 116/77 99/70 122/71 128/76  Pulse: 84 73 91 76  Resp: 17 17 17 15   Temp: 98.9 F (37.2 C) 98.2 F (36.8 C) 98.4 F (36.9 C) 98.3 F (36.8 C)  TempSrc: Oral Oral Oral Oral  SpO2: 97% 96% 100% 97%  Weight:      Height:        Intake/Output Summary (Last 24 hours) at 04/08/2021 1035 Last data filed at 04/07/2021 1500 Gross per 24 hour  Intake 260 ml  Output 700 ml  Net -440 ml   Filed Weights   04/02/21 1544 04/03/21 1659  Weight: 49.4 kg 49.4 kg    Examination:  Awake coherent no distress EOMI NCAT no focal deficit ROM intact no focal deficit  CTA B no rales no rhonchi throughout Abdomen soft nontender no rebound no guarding Left lower extremity in bulky boot   Data Reviewed: personally reviewed   CBC  Component Value Date/Time   WBC 7.5 04/06/2021 0659   RBC 3.19 (L) 04/06/2021 0659   HGB 9.6 (L) 04/06/2021 0659   HCT 29.2 (L) 04/06/2021 0659   PLT 159 04/06/2021 0659   MCV 91.5 04/06/2021 0659   MCH 30.1 04/06/2021 0659   MCHC 32.9 04/06/2021 0659   RDW 12.3 04/06/2021 0659   LYMPHSABS 1.1 04/02/2021 1644   MONOABS 0.4 04/02/2021 1644   EOSABS 0.0 04/02/2021 1644   BASOSABS 0.0 04/02/2021  1644   CMP Latest Ref Rng & Units 04/05/2021 04/03/2021 04/02/2021  Glucose 70 - 99 mg/dL 112(H) 126(H) -  BUN 6 - 20 mg/dL 11 19 -  Creatinine 0.44 - 1.00 mg/dL 0.93 0.83 0.91  Sodium 135 - 145 mmol/L 137 138 -  Potassium 3.5 - 5.1 mmol/L 4.1 4.5 -  Chloride 98 - 111 mmol/L 103 106 -  CO2 22 - 32 mmol/L 28 26 -  Calcium 8.9 - 10.3 mg/dL 8.1(L) 8.7(L) -  Total Protein 6.5 - 8.1 g/dL - 6.5 -  Total Bilirubin 0.3 - 1.2 mg/dL - 0.6 -  Alkaline Phos 38 - 126 U/L - 47 -  AST 15 - 41 U/L - 28 -  ALT 0 - 44 U/L - 19 -     Radiology Studies: No results found.   Scheduled Meds:  acetaminophen  1,000 mg Oral TID   aspirin  325 mg Oral Daily   bisacodyl  10 mg Rectal Once   citalopram  20 mg Oral QODAY   citalopram  30 mg Oral QODAY   famotidine  20 mg Oral BID   loratadine  10 mg Oral Daily   multivitamin with minerals  1 tablet Oral Daily   senna-docusate  2 tablet Oral QHS   Continuous Infusions:   LOS: 6 days   Time spent: Hass[place], MD [facility] To contact the attending provider between 7A-7P or the covering provider during after hours 7P-7A, please log into the web site [email or web address] and access using universal [place] password for that web site. If you do not have the password, please call the hospital operator.  04/08/2021, 10:35 AM

## 2021-04-08 NOTE — Progress Notes (Signed)
Progress note:  Carol [name] OEV:[id number] DOB: 06/10/1971  PCP: Leighton Ruff, MD  Admit date: 04/02/2021 Discharge date: 04/08/2021  Time spent: 35 minutes.  Recommendations for Outpatient Follow-up:  Follow-up with orthopedic surgery Follow-up with your primary care provider Patient to follow with dermatology 04/09/20 08:45 for follow up Continue PT OT with assistance and fall precautions.  Discharge Diagnoses:  Active Hospital Problems   Diagnosis Date Noted   Closed left ankle fracture 04/02/2021   Restless legs syndrome 06/18/2020   Hypercholesterolemia 06/18/2020   Gastroesophageal reflux disease 06/18/2020   Anxiety 06/18/2020   Grover's disease 09/15/2015   Seborrheic keratosis 01/25/2012    Resolved Hospital Problems  No resolved problems to display.    Discharge Condition: Stable  Diet recommendation: Resume previous diet.  Vitals:   04/08/21 0717 04/08/21 1437  BP: 128/76 114/84  Pulse: 76 79  Resp: 15 14  Temp: 98.3 F (36.8 C) 97.9 F (36.6 C)  SpO2: 97% 98%    History of present illness:  50 year old female  with medical history significant of GERD, hyperlipidemia, chronic anxiety/depression who presented to [facility] ED after she fell off of the monkey bars, experienced immediate pain and deformity of the left ankle and was unable to ambulate.  In the emergency room imaging studies demonstrated a distal tib-fib fracture.  She was given conscious sedation and a gentle manipulation maneuver was performed by the emergency room staff and a splint was applied.  She was seen by orthopedic surgery, post open reduction internal fixation TBI/fibular fracture on 04/04/2021 by Dr. Kathaleen Bury.  Recommended strict nonweightbearing to left lower extremity and maximum elevation.  To maintain short leg splint left lower extremity until follow-up.  Aspirin 325 mg daily x30 days for DVT prophylaxis.  Physical therapy to assist with gait and nonweightbearing restrictions.   Sutures out in 2 to 3 weeks with exchange of short leg splint to short leg cast in outpatient office.  She was evaluated by PT OT on 04/05/2021 with recommendation for home health PT.  Hospital Course Complicated by Possible medication allergy causing skin rash for which pharmacy was consulted to assist with identification of possible medication and treatment of her skin rash.   Hospital Course:  Principal Problem:   Closed left ankle fracture Active Problems:   Anxiety   Gastroesophageal reflux disease   Grover's disease   Hypercholesterolemia   Seborrheic keratosis   Restless legs syndrome  POD #3 post ORIF left ankle fracture post mechanical fall, on 04/04/2021 by orthopedic surgery, Dr. Kathaleen Bury. Distal tibiofibular.   Received IV clindamycin and IV fluid perioperatively. Per orthopedic surgery no weightbearing restriction.  Sutures out in 2 to 3 weeks with exchange of short leg splint to a short leg cast in outpatient office.  Full dose aspirin 325 mg daily x30 days for DVT prophylaxis. Seen by PT OT, PT recommended home health PT.   DME 3 and 1 bedside commode, DME rolling walker, DME tub bench. Follow-up with orthopedic surgery.  Possible medication allergy causing skin rash  Pharmacy consulted to assist with identification of possible medication and treatment of her skin rash. Clindamycin might be the cause, still unclear. The rash is papulo macular with raised edges and very likely 2/2 either 1 time dose of Clindamycin on 10/9, or MSIR which we have held   Improving, acute blood loss anemia postsurgery Baseline hemoglobin 11.4 Hemoglobin Trending, 9.6 from 8.7 Follow-up with your PCP.   Chronic anxiety disorder: Continue home Celexa.   Seborrheic keratosis: Recommend to  follow up with your PCP or dermatology.  In the hospital received Sarna prn for dry skin.  Skin rash, diffuse Patient has several allergies and skin conditions unclear if it is from pain medication  or antibiotics.  Treat symptomatically and avoid causing agents  Constipation likely opiate induced Senokot 2 tablets daily Dulcolax suppository as needed     DVT prophylaxis: Aspirin 325 mg daily x30 days.  Code Status: Full code    Procedures:  ORIF left ankle fracture post mechanical fall on 04/04/2021 by orthopedic surgery, Dr. Kathaleen Bury.  Consultations: Orthopedic surgery  Discharge Exam: BP 114/84 (BP Location: Left Arm)   Pulse 79   Temp 97.9 F (36.6 C) (Oral)   Resp 14   Ht 5\' 1"  (1.549 m)   Wt 49.4 kg   LMP 07/31/2018   SpO2 98%   BMI 20.60 kg/m  General: 50 y.o. year-old female well-developed well-nourished in no acute distress.  She is alert and oriented x3. Cardiovascular: Regular rate and rhythm no rubs or gallops.  Respiratory: Clear auscultation with no wheezes or rales.  Abdomen: Soft nontender no bowel sounds present.  Sounds x4 quadrants. Musculoskeletal: Left lower extremity in splint.  Skin:    Psychiatry: Mood is appropriate for condition and setting.  Discharge Instructions You were cared for by a hospitalist during your hospital stay. If you have any questions about your discharge medications or the care you received while you were in the hospital after you are discharged, you can call the unit and asked to speak with the hospitalist on call if the hospitalist that took care of you is not available. Once you are discharged, your primary care physician will handle any further medical issues. Please note that NO REFILLS for any discharge medications will be authorized once you are discharged, as it is imperative that you return to your primary care physician (or establish a relationship with a primary care physician if you do not have one) for your aftercare needs so that they can reassess your need for medications and monitor your lab values.  Discharge Instructions     Discharge patient   Complete by: As directed    Discharge disposition: 01-Home  or Self Care   Discharge patient date: 04/08/2021      Allergies as of 04/08/2021       Reactions   Doxycycline Rash   Keflex [cephalexin] Rash   Minocycline Rash   Sulfa Antibiotics Rash   Other reaction(s): Other (See Comments), Other (See Comments)   Sulfacetamide Sodium Rash        Medication List     TAKE these medications    aspirin 325 MG tablet Take 1 tablet (325 mg total) by mouth daily.   camphor-menthol lotion Commonly known as: SARNA Apply topically as needed for itching.   citalopram 20 MG tablet Commonly known as: CELEXA Take 20 mg by mouth every other day.   citalopram 40 MG tablet Commonly known as: CELEXA Take 30 mg by mouth every other day.   diphenhydrAMINE 25 mg capsule Commonly known as: BENADRYL Take 1 capsule (25 mg total) by mouth every 4 (four) hours as needed for itching or allergies.   fluticasone 50 MCG/ACT nasal spray Commonly known as: FLONASE Place 1 spray into both nostrils daily as needed for allergies.   HAIR SKIN & NAILS ADVANCED PO Take 1 tablet by mouth daily.   methocarbamol 500 MG tablet Commonly known as: ROBAXIN Take 1 tablet (500 mg total) by mouth every 6 (  six) hours as needed for muscle spasms.   multivitamin with minerals Tabs tablet Take 1 tablet by mouth daily.   oxyCODONE-acetaminophen 7.5-325 MG tablet Commonly known as: PERCOCET Take 1-2 tablets by mouth every 8 (eight) hours as needed for up to 5 days for severe pain.   scopolamine 1 MG/3DAYS Commonly known as: TRANSDERM-SCOP Place 1 patch onto the skin every 3 (three) days.   terbinafine 250 MG tablet Commonly known as: LamISIL Take 1 tablet (250 mg total) by mouth daily.               Durable Medical Equipment  (From admission, onward)           Start     Ordered   04/07/21 0713  For home use only DME Crutches  Once        04/07/21 1779   04/06/21 1125  For home use only DME Walker rolling  Once       Question Answer Comment   Walker: With [place] Wheels   Patient needs a walker to treat with the following condition Weakness      04/06/21 1124   04/05/21 1845  For home use only DME Tub bench  Once        04/05/21 1844   04/05/21 1243  For home use only DME 3 n 1  Once        04/05/21 1242           Allergies  Allergen Reactions   Doxycycline Rash   Keflex [Cephalexin] Rash   Minocycline Rash   Sulfa Antibiotics Rash    Other reaction(s): Other (See Comments), Other (See Comments)   Sulfacetamide Sodium Rash    Follow-up Information     Leighton Ruff, MD. Call in 1 day(s).   Specialty: Family Medicine Why: Please call for a posthospital follow-up appointment. Contact information: [place] [address] [phone]         Armond Hang, MD. Call in 1 day(s).   Specialty: Orthopedic Surgery Why: Please call for a posthospital follow-up appointment. Contact information: [address] [place] [address] [phone]         [facility] Follow up.   Specialty: Home Health Services Contact information: [address] [place] [address] [phone]         [facility] Follow up.   Why: Company walker and 3 in 1 was ordered through Contact information: [address] [place] [address] [phone]         Audrie Lia, MD. Call today.   Specialty: Dermatology                 The results of significant diagnostics from this hospitalization (including imaging, microbiology, ancillary and laboratory) are listed below for reference.    Significant Diagnostic Studies: DG Tibia/Fibula Left  Result Date: 04/02/2021 CLINICAL DATA:  Fall. Pain is at ankle in just above the ankle. Pulses intact. EXAM: LEFT ANKLE COMPLETE - 3+ VIEW; LEFT TIBIA AND FIBULA - 2 VIEW COMPARISON:  None. FINDINGS: Overlying splint material obscures fine osseous detail. Comminuted transverse fracture of  the distal tibia. The tibial plafond appears intact, however there is probable fracture extension into the distal tibiofibular joint. The distal fracture fragment is displaced laterally approximately 1.4 cm with mild medial apex angulation. No significant impaction or distraction. Comminuted transverse fracture of the distal fibula at the level of the distal tibiofibular joint.  The distal fracture fragment is displaced approximately 1 cm laterally with 0.9 cm of impaction. Mild medial apex angulation. Talar dome is intact. No ankle or knee joint dislocation. No other fracture in the left lower leg. IMPRESSION: 1. Comminuted mildly displaced transverse fracture of the distal tibia, extending to the distal tibiofibular joint. 2. Comminuted mildly displaced transverse fracture of the distal fibula at the level of the distal tibiofibular joint with mild impaction. 3. No ankle or knee joint dislocation. Electronically Signed   By: Ileana Roup M.D.   On: 04/02/2021 16:58   DG Ankle Complete Left  Result Date: 04/04/2021 CLINICAL DATA:  ORIF EXAM: LEFT ANKLE COMPLETE - 3+ VIEW COMPARISON:  04/02/2021 FINDINGS: 8 fluoroscopic images are obtained during the performance of the procedure and are provided for interpretation only. Images demonstrate plate and screw fixations across the distal fibular and tibial fractures, with near anatomic alignment. Please refer to the operative report. FLUOROSCOPY TIME:  4 minutes 32 seconds IMPRESSION: 1. ORIF left ankle. Electronically Signed   By: Randa Ngo M.D.   On: 04/04/2021 15:25   DG Ankle Complete Left  Result Date: 04/02/2021 CLINICAL DATA:  Post reduction EXAM: LEFT ANKLE COMPLETE - 3+ VIEW COMPARISON:  04/02/2021 FINDINGS: Comminuted distal tibial fracture with residual 1/4 shaft diameter lateral and just under 1/4 shaft diameter anterior displacement of distal fracture fragment. Mild lateral angulation which is slightly decreased compared to prior. Acute comminuted  distal fibular fracture with residual 1/2 shaft diameter lateral displacement. Residual mild lateral angulation of distal fracture fragment, also decreased compared to prior. Mortise appears symmetric. IMPRESSION: Acute comminuted distal tibial and fibular fractures with residual displacement and angulation, though decreased compared to prior Electronically Signed   By: Donavan Foil M.D.   On: 04/02/2021 18:18   DG Ankle Complete Left  Result Date: 04/02/2021 CLINICAL DATA:  Fall. Pain is at ankle in just above the ankle. Pulses intact. EXAM: LEFT ANKLE COMPLETE - 3+ VIEW; LEFT TIBIA AND FIBULA - 2 VIEW COMPARISON:  None. FINDINGS: Overlying splint material obscures fine osseous detail. Comminuted transverse fracture of the distal tibia. The tibial plafond appears intact, however there is probable fracture extension into the distal tibiofibular joint. The distal fracture fragment is displaced laterally approximately 1.4 cm with mild medial apex angulation. No significant impaction or distraction. Comminuted transverse fracture of the distal fibula at the level of the distal tibiofibular joint. The distal fracture fragment is displaced approximately 1 cm laterally with 0.9 cm of impaction. Mild medial apex angulation. Talar dome is intact. No ankle or knee joint dislocation. No other fracture in the left lower leg. IMPRESSION: 1. Comminuted mildly displaced transverse fracture of the distal tibia, extending to the distal tibiofibular joint. 2. Comminuted mildly displaced transverse fracture of the distal fibula at the level of the distal tibiofibular joint with mild impaction. 3. No ankle or knee joint dislocation. Electronically Signed   By: Ileana Roup M.D.   On: 04/02/2021 16:58   CT ANKLE LEFT WO CONTRAST  Result Date: 04/03/2021 CLINICAL DATA:  Distal tibial and fibular fractures. EXAM: CT OF THE LEFT ANKLE WITHOUT CONTRAST TECHNIQUE: Multidetector CT imaging of the left ankle was performed according  to the standard protocol. Multiplanar CT image reconstructions were also generated. COMPARISON:  None. FINDINGS: Bones/Joint/Cartilage Comminuted transverse fracture of the distal tibial metaphysis with 7 mm of lateral displacement and 4 mm of anterior displacement. Mildly comminuted transverse distal fibular metaphysis fracture with 8 mm of lateral displacement and 5  mm of anterior displacement. No other fracture or dislocation. No aggressive osseous lesion. Ankle mortise is intact. No joint effusion. Ligaments Ligaments are suboptimally evaluated by CT. Muscles and Tendons Flexor, extensor, peroneal and Achilles tendons are grossly intact. Plantar fascia is grossly intact. Soft tissue No fluid collection or hematoma. No soft tissue mass. Soft tissue swelling over the medial and lateral aspect of the ankle. IMPRESSION: 1. Comminuted transverse fracture of the distal tibial metaphysis with 7 mm of lateral displacement and 4 mm of anterior displacement. 2. Mildly comminuted transverse distal fibular metaphysis fracture with 8 mm of lateral displacement and 5 mm of anterior displacement. Electronically Signed   By: Kathreen Devoid M.D.   On: 04/03/2021 18:48   DG C-Arm 1-60 Min-No Report  Result Date: 04/04/2021 Fluoroscopy was utilized by the requesting physician.  No radiographic interpretation.   DG C-Arm 1-60 Min-No Report  Result Date: 04/04/2021 Fluoroscopy was utilized by the requesting physician.  No radiographic interpretation.   DG C-Arm 1-60 Min-No Report  Result Date: 04/04/2021 Fluoroscopy was utilized by the requesting physician.  No radiographic interpretation.    Microbiology: Recent Results (from the past 240 hour(s))  Resp Panel by RT-PCR (Flu A&B, Covid) Nasopharyngeal Swab     Status: None   Collection Time: 04/02/21  5:14 PM   Specimen: Nasopharyngeal Swab; Nasopharyngeal(NP) swabs in vial transport medium  Result Value Ref Range Status   SARS Coronavirus 2 by RT PCR NEGATIVE  NEGATIVE Final    Comment: (NOTE) SARS-CoV-2 target nucleic acids are NOT DETECTED.  The SARS-CoV-2 RNA is generally detectable in upper respiratory specimens during the acute phase of infection. The lowest concentration of SARS-CoV-2 viral copies this assay can detect is 138 copies/mL. A negative result does not preclude SARS-Cov-2 infection and should not be used as the sole basis for treatment or other patient management decisions. A negative result may occur with  improper specimen collection/handling, submission of specimen other than nasopharyngeal swab, presence of viral mutation(s) within the areas targeted by this assay, and inadequate number of viral copies(<138 copies/mL). A negative result must be combined with clinical observations, patient history, and epidemiological information. The expected result is Negative.  Fact Sheet for Patients:  EntrepreneurPulse.com.au  Fact Sheet for Healthcare Providers:  IncredibleEmployment.be  This test is no t yet approved or cleared by the [place] FDA and  has been authorized for detection and/or diagnosis of SARS-CoV-2 by FDA under an Emergency Use Authorization (EUA). This EUA will remain  in effect (meaning this test can be used) for the duration of the COVID-19 declaration under Section 564(b)(1) of the Act, 21 U.S.C.section 360bbb-3(b)(1), unless the authorization is terminated  or revoked sooner.       Influenza A by PCR NEGATIVE NEGATIVE Final   Influenza B by PCR NEGATIVE NEGATIVE Final    Comment: (NOTE) The Xpert Xpress SARS-CoV-2/FLU/RSV plus assay is intended as an aid in the diagnosis of influenza from Nasopharyngeal swab specimens and should not be used as a sole basis for treatment. Nasal washings and aspirates are unacceptable for Xpert Xpress SARS-CoV-2/FLU/RSV testing.  Fact Sheet for Patients: EntrepreneurPulse.com.au  Fact Sheet for Healthcare  Providers: IncredibleEmployment.be  This test is not yet approved or cleared by the [place] FDA and has been authorized for detection and/or diagnosis of SARS-CoV-2 by FDA under an Emergency Use Authorization (EUA). This EUA will remain in effect (meaning this test can be used) for the duration of the COVID-19 declaration under Section 564(b)(1) of the  Act, 21 U.S.C. section 360bbb-3(b)(1), unless the authorization is terminated or revoked.  Performed at [facility], [place] [address] [place], [place] [address]   MRSA Next Gen by PCR, Nasal     Status: None   Collection Time: 04/04/21  8:26 AM   Specimen: Nasal Mucosa; Nasal Swab  Result Value Ref Range Status   MRSA by PCR Next Gen NOT DETECTED NOT DETECTED Final    Comment: (NOTE) The GeneXpert MRSA Assay (FDA approved for NASAL specimens only), is one component of a comprehensive MRSA colonization surveillance program. It is not intended to diagnose MRSA infection nor to guide or monitor treatment for MRSA infections. Test performance is not FDA approved in patients less than 38 years old. Performed at [facility], [place] [address] [place], [place] [address]      Labs: Basic Metabolic Panel: Recent Labs  Lab 04/02/21 1644 04/02/21 2253 04/03/21 0449 04/05/21 0717  NA 139  --  138 137  K 3.9  --  4.5 4.1  CL 109  --  106 103  CO2 23  --  26 28  GLUCOSE 105*  --  126* 112*  BUN 25*  --  19 11  CREATININE 0.85 0.91 0.83 0.93  CALCIUM 8.3*  --  8.7* 8.1*  MG  --   --   --  1.7  PHOS  --   --   --  3.4    Liver Function Tests: Recent Labs  Lab 04/03/21 0449  AST 28  ALT 19  ALKPHOS 47  BILITOT 0.6  PROT 6.5  ALBUMIN 4.0    No results for input(s): LIPASE, AMYLASE in the last 168 hours. No results for input(s): AMMONIA in the last 168 hours. CBC: Recent Labs  Lab 04/02/21 1644 04/02/21 2253 04/03/21 0449 04/05/21 0717 04/06/21 0659  WBC 6.3 8.1 7.5  8.8 7.5  NEUTROABS 4.6  --   --   --   --   HGB 11.1* 11.2* 11.4* 8.7* 9.6*  HCT 33.2* 33.7* 34.8* 26.1* 29.2*  MCV 90.5 91.3 92.3 89.7 91.5  PLT 154 161 154 129* 159    Cardiac Enzymes: No results for input(s): CKTOTAL, CKMB, CKMBINDEX, TROPONINI in the last 168 hours. BNP: BNP (last 3 results) No results for input(s): BNP in the last 8760 hours.  ProBNP (last 3 results) No results for input(s): PROBNP in the last 8760 hours.  CBG: No results for input(s): GLUCAP in the last 168 hours.     Signed:  Nita Sells, MD [facility] 04/08/2021, 2:41 PM

## 2021-04-08 NOTE — Progress Notes (Signed)
PT Cancellation Note  Patient Details Name: Carol [name] MRN: [id number] DOB: 12-Jul-1970   Cancelled Treatment:    Reason Eval/Treat Not Completed: Other (comment)  Checked on pt for dc needs/questions related to PT;  Questions solicited and answered;  She had a visitor and politely declined more practice with crutches;   Overall she is moving well enough with RW;  OK for dc home from PT standpoint;   Roney Marion, [place] Pager [phone] Office [phone]    Colletta [place] 04/08/2021, 2:28 PM

## 2021-04-08 NOTE — Progress Notes (Signed)
Pt seen and evaluated as per hospitalist request for pt complaint of loose tooth. Pt seen and examined sitting comfortably in bed. Upon entering the room, pt was extremely concerned about rash covering most of her torso. When asked about her tooth, she pointed to tooth #24 (lower left front) and demonstrated very little movement when trying to wiggle the tooth with her finger. She stated that when she bit into a cracker using her front teeth she felt like the tooth was slightly painful and had a little "give" to it. I explained to her that dental injury is a known complication of general anesthesia. Given the location of the tooth, I suspect she may have bit down upon waking up from GA- possibly biting a plastic oral airway.    She stated she does have a dental appointment in November; I advised her to move this up if she continued to feel like this tooth is loose.  She repeatedly stated that she is more concerned with the rash and pruritis she is experiencing, although she has a dermatology appointment set up for tomorrow at [facility]. I reassured her that this is most commonly a drug rash 2/2 antibiotics. Switching pain medications from morphine to a different opiate may alleviate some pruritis as well, to decrease histamine release.   Please call with any further questions.

## 2021-04-09 DIAGNOSIS — Z85820 Personal history of malignant melanoma of skin: Secondary | ICD-10-CM | POA: Diagnosis not present

## 2021-04-09 DIAGNOSIS — L270 Generalized skin eruption due to drugs and medicaments taken internally: Secondary | ICD-10-CM | POA: Diagnosis not present

## 2021-04-09 DIAGNOSIS — Z85828 Personal history of other malignant neoplasm of skin: Secondary | ICD-10-CM | POA: Diagnosis not present

## 2021-04-09 DIAGNOSIS — R21 Rash and other nonspecific skin eruption: Secondary | ICD-10-CM | POA: Diagnosis not present

## 2021-04-10 NOTE — Discharge Summary (Addendum)
Carol [name] [id number] DOB: May 26, 1971  PCP: Marda Stalker, PA-C  Admit date: 04/02/2021 Discharge date: 04/10/2021  Time spent: 35 minutes.  Recommendations for Outpatient Follow-up:  Follow-up with orthopedic surgery Follow-up with your primary care provider Patient to follow with dermatology 04/09/20 08:45 for follow up Continue PT OT with assistance and fall precautions.  Discharge Diagnoses:  Active Hospital Problems   Diagnosis Date Noted   Closed left ankle fracture 04/02/2021   Restless legs syndrome 06/18/2020   Hypercholesterolemia 06/18/2020   Gastroesophageal reflux disease 06/18/2020   Anxiety 06/18/2020   Grover's disease 09/15/2015   Seborrheic keratosis 01/25/2012    Resolved Hospital Problems  No resolved problems to display.    Discharge Condition: Stable  Diet recommendation: Resume previous diet.  Vitals:   04/08/21 0717 04/08/21 1437  BP: 128/76 114/84  Pulse: 76 79  Resp: 15 14  Temp: 98.3 F (36.8 C) 97.9 F (36.6 C)  SpO2: 97% 98%    History of present illness:  50 year old female  with medical history significant of GERD, hyperlipidemia, chronic anxiety/depression who presented to [facility] ED after she fell off of the monkey bars, experienced immediate pain and deformity of the left ankle and was unable to ambulate.  In the emergency room imaging studies demonstrated a distal tib-fib fracture.  She was given conscious sedation and a gentle manipulation maneuver was performed by the emergency room staff and a splint was applied.  She was seen by orthopedic surgery, post open reduction internal fixation TBI/fibular fracture on 04/04/2021 by Dr. Kathaleen Bury.  Recommended strict nonweightbearing to left lower extremity and maximum elevation.  To maintain short leg splint left lower extremity until follow-up.  Aspirin 325 mg daily x30 days for DVT prophylaxis.  Physical therapy to assist with gait and nonweightbearing restrictions.  Sutures out in 2  to 3 weeks with exchange of short leg splint to short leg cast in outpatient office.  She was evaluated by PT OT on 04/05/2021 with recommendation for home health PT.  Hospital Course Complicated by Possible medication allergy causing skin rash for which pharmacy was consulted to assist with identification of possible medication and treatment of her skin rash.   Hospital Course:  Principal Problem:   Closed left ankle fracture Active Problems:   Anxiety   Gastroesophageal reflux disease   Grover's disease   Hypercholesterolemia   Seborrheic keratosis   Restless legs syndrome  POD #3 post ORIF left ankle fracture post mechanical fall, on 04/04/2021 by orthopedic surgery, Dr. Kathaleen Bury. Distal tibiofibular.   Received IV clindamycin and IV fluid perioperatively. Per orthopedic surgery no weightbearing restriction.  Sutures out in 2 to 3 weeks with exchange of short leg splint to a short leg cast in outpatient office.  Full dose aspirin 325 mg daily x30 days for DVT prophylaxis. Seen by PT OT, PT recommended home health PT.   DME 3 and 1 bedside commode, DME rolling walker, DME tub bench. Follow-up with orthopedic surgery.  Possible medication allergy causing skin rash  Pharmacy consulted to assist with identification of possible medication and treatment of her skin rash. Clindamycin might be the cause, still unclear. The rash is papulo macular with raised edges and very likely 2/2 either 1 time dose of Clindamycin on 10/9, or MSIR which we have held   Improving, acute blood loss anemia postsurgery Baseline hemoglobin 11.4 Hemoglobin Trending, 9.6 from 8.7 Follow-up with your PCP.   Chronic anxiety disorder: Continue home Celexa.   Seborrheic keratosis: Recommend to follow  up with your PCP or dermatology.  In the hospital received Sarna prn for dry skin.  Skin rash, diffuse Patient has several allergies and skin conditions unclear if it is from pain medication or antibiotics.   Treat symptomatically and avoid causing agents  Constipation likely opiate induced Senokot 2 tablets daily Dulcolax suppository as needed     DVT prophylaxis: Aspirin 325 mg daily x30 days.  Code Status: Full code    Procedures:  ORIF left ankle fracture post mechanical fall on 04/04/2021 by orthopedic surgery, Dr. Kathaleen Bury.  Consultations: Orthopedic surgery  Discharge Exam: BP 114/84 (BP Location: Left Arm)   Pulse 79   Temp 97.9 F (36.6 C) (Oral)   Resp 14   Ht 5\' 1"  (1.549 m)   Wt 49.4 kg   LMP 07/31/2018   SpO2 98%   BMI 20.60 kg/m  General: 50 y.o. year-old female well-developed well-nourished in no acute distress.  She is alert and oriented x3. Cardiovascular: Regular rate and rhythm no rubs or gallops.  Respiratory: Clear auscultation with no wheezes or rales.  Abdomen: Soft nontender no bowel sounds present.  Sounds x4 quadrants. Musculoskeletal: Left lower extremity in splint.  Skin:    Psychiatry: Mood is appropriate for condition and setting.  Discharge Instructions You were cared for by a hospitalist during your hospital stay. If you have any questions about your discharge medications or the care you received while you were in the hospital after you are discharged, you can call the unit and asked to speak with the hospitalist on call if the hospitalist that took care of you is not available. Once you are discharged, your primary care physician will handle any further medical issues. Please note that NO REFILLS for any discharge medications will be authorized once you are discharged, as it is imperative that you return to your primary care physician (or establish a relationship with a primary care physician if you do not have one) for your aftercare needs so that they can reassess your need for medications and monitor your lab values.  Discharge Instructions     Discharge patient   Complete by: As directed    Discharge disposition: 01-Home or Self Care    Discharge patient date: 04/08/2021      Allergies as of 04/08/2021       Reactions   Doxycycline Rash   Keflex [cephalexin] Rash   Minocycline Rash   Sulfa Antibiotics Rash   Other reaction(s): Other (See Comments), Other (See Comments)   Sulfacetamide Sodium Rash        Medication List     TAKE these medications    aspirin 325 MG tablet Take 1 tablet (325 mg total) by mouth daily.   camphor-menthol lotion Commonly known as: SARNA Apply topically as needed for itching.   citalopram 20 MG tablet Commonly known as: CELEXA Take 20 mg by mouth every other day.   citalopram 40 MG tablet Commonly known as: CELEXA Take 30 mg by mouth every other day.   diphenhydrAMINE 25 mg capsule Commonly known as: BENADRYL Take 1 capsule (25 mg total) by mouth every 4 (four) hours as needed for itching or allergies.   fluticasone 50 MCG/ACT nasal spray Commonly known as: FLONASE Place 1 spray into both nostrils daily as needed for allergies.   HAIR SKIN & NAILS ADVANCED PO Take 1 tablet by mouth daily.   methocarbamol 500 MG tablet Commonly known as: ROBAXIN Take 1 tablet (500 mg total) by mouth every 6 (six)  hours as needed for muscle spasms.   multivitamin with minerals Tabs tablet Take 1 tablet by mouth daily.   oxyCODONE-acetaminophen 7.5-325 MG tablet Commonly known as: PERCOCET Take 1-2 tablets by mouth every 8 (eight) hours as needed for up to 5 days for severe pain.   scopolamine 1 MG/3DAYS Commonly known as: TRANSDERM-SCOP Place 1 patch onto the skin every 3 (three) days.   terbinafine 250 MG tablet Commonly known as: LamISIL Take 1 tablet (250 mg total) by mouth daily.       Allergies  Allergen Reactions   Doxycycline Rash   Keflex [Cephalexin] Rash   Minocycline Rash   Sulfa Antibiotics Rash    Other reaction(s): Other (See Comments), Other (See Comments)   Sulfacetamide Sodium Rash    Follow-up Information     Leighton Ruff, MD. Call in 1  day(s).   Specialty: Family Medicine Why: Please call for a posthospital follow-up appointment. Contact information: [place] [address] [phone]         Armond Hang, MD. Call in 1 day(s).   Specialty: Orthopedic Surgery Why: Please call for a posthospital follow-up appointment. Contact information: [address], [address] [place] [address] [phone]         [facility] Follow up.   Specialty: Home Health Services Contact information: [address] [place]  [address] [phone]         [facility] Follow up.   Why: Company walker and 3 in 1 was ordered through Contact information: [address] [place] [address] [phone]         Audrie Lia, MD. Call today.   Specialty: Dermatology                 The results of significant diagnostics from this hospitalization (including imaging, microbiology, ancillary and laboratory) are listed below for reference.    Significant Diagnostic Studies: DG Tibia/Fibula Left  Result Date: 04/02/2021 CLINICAL DATA:  Fall. Pain is at ankle in just above the ankle. Pulses intact. EXAM: LEFT ANKLE COMPLETE - 3+ VIEW; LEFT TIBIA AND FIBULA - 2 VIEW COMPARISON:  None. FINDINGS: Overlying splint material obscures fine osseous detail. Comminuted transverse fracture of the distal tibia. The tibial plafond appears intact, however there is probable fracture extension into the distal tibiofibular joint. The distal fracture fragment is displaced laterally approximately 1.4 cm with mild medial apex angulation. No significant impaction or distraction. Comminuted transverse fracture of the distal fibula at the level of the distal tibiofibular joint. The distal fracture fragment is displaced approximately 1 cm laterally with 0.9 cm of impaction. Mild medial apex angulation. Talar dome is intact. No ankle or knee joint dislocation. No other  fracture in the left lower leg. IMPRESSION: 1. Comminuted mildly displaced transverse fracture of the distal tibia, extending to the distal tibiofibular joint. 2. Comminuted mildly displaced transverse fracture of the distal fibula at the level of the distal tibiofibular joint with mild impaction. 3. No ankle or knee joint dislocation. Electronically Signed   By: Ileana Roup M.D.   On: 04/02/2021 16:58   DG Ankle Complete Left  Result Date: 04/04/2021 CLINICAL DATA:  ORIF EXAM: LEFT ANKLE COMPLETE - 3+ VIEW COMPARISON:  04/02/2021 FINDINGS: 8 fluoroscopic images are obtained during the performance of the procedure and are provided for interpretation only. Images demonstrate plate and screw fixations across the distal fibular and tibial fractures, with near anatomic alignment. Please refer to the operative  report. FLUOROSCOPY TIME:  4 minutes 32 seconds IMPRESSION: 1. ORIF left ankle. Electronically Signed   By: Randa Ngo M.D.   On: 04/04/2021 15:25   DG Ankle Complete Left  Result Date: 04/02/2021 CLINICAL DATA:  Post reduction EXAM: LEFT ANKLE COMPLETE - 3+ VIEW COMPARISON:  04/02/2021 FINDINGS: Comminuted distal tibial fracture with residual 1/4 shaft diameter lateral and just under 1/4 shaft diameter anterior displacement of distal fracture fragment. Mild lateral angulation which is slightly decreased compared to prior. Acute comminuted distal fibular fracture with residual 1/2 shaft diameter lateral displacement. Residual mild lateral angulation of distal fracture fragment, also decreased compared to prior. Mortise appears symmetric. IMPRESSION: Acute comminuted distal tibial and fibular fractures with residual displacement and angulation, though decreased compared to prior Electronically Signed   By: Donavan Foil M.D.   On: 04/02/2021 18:18   DG Ankle Complete Left  Result Date: 04/02/2021 CLINICAL DATA:  Fall. Pain is at ankle in just above the ankle. Pulses intact. EXAM: LEFT ANKLE COMPLETE  - 3+ VIEW; LEFT TIBIA AND FIBULA - 2 VIEW COMPARISON:  None. FINDINGS: Overlying splint material obscures fine osseous detail. Comminuted transverse fracture of the distal tibia. The tibial plafond appears intact, however there is probable fracture extension into the distal tibiofibular joint. The distal fracture fragment is displaced laterally approximately 1.4 cm with mild medial apex angulation. No significant impaction or distraction. Comminuted transverse fracture of the distal fibula at the level of the distal tibiofibular joint. The distal fracture fragment is displaced approximately 1 cm laterally with 0.9 cm of impaction. Mild medial apex angulation. Talar dome is intact. No ankle or knee joint dislocation. No other fracture in the left lower leg. IMPRESSION: 1. Comminuted mildly displaced transverse fracture of the distal tibia, extending to the distal tibiofibular joint. 2. Comminuted mildly displaced transverse fracture of the distal fibula at the level of the distal tibiofibular joint with mild impaction. 3. No ankle or knee joint dislocation. Electronically Signed   By: Ileana Roup M.D.   On: 04/02/2021 16:58   CT ANKLE LEFT WO CONTRAST  Result Date: 04/03/2021 CLINICAL DATA:  Distal tibial and fibular fractures. EXAM: CT OF THE LEFT ANKLE WITHOUT CONTRAST TECHNIQUE: Multidetector CT imaging of the left ankle was performed according to the standard protocol. Multiplanar CT image reconstructions were also generated. COMPARISON:  None. FINDINGS: Bones/Joint/Cartilage Comminuted transverse fracture of the distal tibial metaphysis with 7 mm of lateral displacement and 4 mm of anterior displacement. Mildly comminuted transverse distal fibular metaphysis fracture with 8 mm of lateral displacement and 5 mm of anterior displacement. No other fracture or dislocation. No aggressive osseous lesion. Ankle mortise is intact. No joint effusion. Ligaments Ligaments are suboptimally evaluated by CT. Muscles and  Tendons Flexor, extensor, peroneal and Achilles tendons are grossly intact. Plantar fascia is grossly intact. Soft tissue No fluid collection or hematoma. No soft tissue mass. Soft tissue swelling over the medial and lateral aspect of the ankle. IMPRESSION: 1. Comminuted transverse fracture of the distal tibial metaphysis with 7 mm of lateral displacement and 4 mm of anterior displacement. 2. Mildly comminuted transverse distal fibular metaphysis fracture with 8 mm of lateral displacement and 5 mm of anterior displacement. Electronically Signed   By: Kathreen Devoid M.D.   On: 04/03/2021 18:48   DG C-Arm 1-60 Min-No Report  Result Date: 04/04/2021 Fluoroscopy was utilized by the requesting physician.  No radiographic interpretation.   DG C-Arm 1-60 Min-No Report  Result Date: 04/04/2021 Fluoroscopy was utilized by  the requesting physician.  No radiographic interpretation.   DG C-Arm 1-60 Min-No Report  Result Date: 04/04/2021 Fluoroscopy was utilized by the requesting physician.  No radiographic interpretation.    Microbiology: Recent Results (from the past 240 hour(s))  Resp Panel by RT-PCR (Flu A&B, Covid) Nasopharyngeal Swab     Status: None   Collection Time: 04/02/21  5:14 PM   Specimen: Nasopharyngeal Swab; Nasopharyngeal(NP) swabs in vial transport medium  Result Value Ref Range Status   SARS Coronavirus 2 by RT PCR NEGATIVE NEGATIVE Final    Comment: (NOTE) SARS-CoV-2 target nucleic acids are NOT DETECTED.  The SARS-CoV-2 RNA is generally detectable in upper respiratory specimens during the acute phase of infection. The lowest concentration of SARS-CoV-2 viral copies this assay can detect is 138 copies/mL. A negative result does not preclude SARS-Cov-2 infection and should not be used as the sole basis for treatment or other patient management decisions. A negative result may occur with  improper specimen collection/handling, submission of specimen other than nasopharyngeal swab,  presence of viral mutation(s) within the areas targeted by this assay, and inadequate number of viral copies(<138 copies/mL). A negative result must be combined with clinical observations, patient history, and epidemiological information. The expected result is Negative.  Fact Sheet for Patients:  EntrepreneurPulse.com.au  Fact Sheet for Healthcare Providers:  IncredibleEmployment.be  This test is no t yet approved or cleared by the [place] FDA and  has been authorized for detection and/or diagnosis of SARS-CoV-2 by FDA under an Emergency Use Authorization (EUA). This EUA will remain  in effect (meaning this test can be used) for the duration of the COVID-19 declaration under Section 564(b)(1) of the Act, 21 U.S.C.section 360bbb-3(b)(1), unless the authorization is terminated  or revoked sooner.       Influenza A by PCR NEGATIVE NEGATIVE Final   Influenza B by PCR NEGATIVE NEGATIVE Final    Comment: (NOTE) The Xpert Xpress SARS-CoV-2/FLU/RSV plus assay is intended as an aid in the diagnosis of influenza from Nasopharyngeal swab specimens and should not be used as a sole basis for treatment. Nasal washings and aspirates are unacceptable for Xpert Xpress SARS-CoV-2/FLU/RSV testing.  Fact Sheet for Patients: EntrepreneurPulse.com.au  Fact Sheet for Healthcare Providers: IncredibleEmployment.be  This test is not yet approved or cleared by the [place] FDA and has been authorized for detection and/or diagnosis of SARS-CoV-2 by FDA under an Emergency Use Authorization (EUA). This EUA will remain in effect (meaning this test can be used) for the duration of the COVID-19 declaration under Section 564(b)(1) of the Act, 21 U.S.C. section 360bbb-3(b)(1), unless the authorization is terminated or revoked.  Performed at [facility], [place] [address], [place], [place] [address]   MRSA  Next Gen by PCR, Nasal     Status: None   Collection Time: 04/04/21  8:26 AM   Specimen: Nasal Mucosa; Nasal Swab  Result Value Ref Range Status   MRSA by PCR Next Gen NOT DETECTED NOT DETECTED Final    Comment: (NOTE) The GeneXpert MRSA Assay (FDA approved for NASAL specimens only), is one component of a comprehensive MRSA colonization surveillance program. It is not intended to diagnose MRSA infection nor to guide or monitor treatment for MRSA infections. Test performance is not FDA approved in patients less than 72 years old. Performed at [facility], [place] [address] [place], [place] [address]      Labs: Basic Metabolic Panel: Recent Labs  Lab 04/05/21 0717  NA 137  K 4.1  CL 103  CO2 28  GLUCOSE 112*  BUN 11  CREATININE 0.93  CALCIUM 8.1*  MG 1.7  PHOS 3.4    Liver Function Tests: No results for input(s): AST, ALT, ALKPHOS, BILITOT, PROT, ALBUMIN in the last 168 hours.  No results for input(s): LIPASE, AMYLASE in the last 168 hours. No results for input(s): AMMONIA in the last 168 hours. CBC: Recent Labs  Lab 04/05/21 0717 04/06/21 0659  WBC 8.8 7.5  HGB 8.7* 9.6*  HCT 26.1* 29.2*  MCV 89.7 91.5  PLT 129* 159    Cardiac Enzymes: No results for input(s): CKTOTAL, CKMB, CKMBINDEX, TROPONINI in the last 168 hours. BNP: BNP (last 3 results) No results for input(s): BNP in the last 8760 hours.  ProBNP (last 3 results) No results for input(s): PROBNP in the last 8760 hours.  CBG: No results for input(s): GLUCAP in the last 168 hours.     Signed:  Nita Sells, MD [facility] 04/10/2021, 4:05 PM

## 2021-04-13 DIAGNOSIS — R21 Rash and other nonspecific skin eruption: Secondary | ICD-10-CM | POA: Diagnosis not present

## 2021-04-14 ENCOUNTER — Other Ambulatory Visit: Payer: Self-pay | Admitting: Family Medicine

## 2021-04-14 ENCOUNTER — Encounter (HOSPITAL_COMMUNITY): Payer: Self-pay | Admitting: Orthopaedic Surgery

## 2021-04-14 DIAGNOSIS — S8292XA Unspecified fracture of left lower leg, initial encounter for closed fracture: Secondary | ICD-10-CM

## 2021-04-21 DIAGNOSIS — S82202D Unspecified fracture of shaft of left tibia, subsequent encounter for closed fracture with routine healing: Secondary | ICD-10-CM | POA: Diagnosis not present

## 2021-04-24 ENCOUNTER — Other Ambulatory Visit: Payer: Self-pay

## 2021-04-24 ENCOUNTER — Ambulatory Visit (INDEPENDENT_AMBULATORY_CARE_PROVIDER_SITE_OTHER): Payer: BC Managed Care – PPO | Admitting: *Deleted

## 2021-04-24 DIAGNOSIS — B351 Tinea unguium: Secondary | ICD-10-CM

## 2021-04-24 NOTE — Progress Notes (Signed)
Patient presents today for the 2nd laser treatment. Diagnosed with mycotic nail infection by Dr. Paulla Dolly.   Toenail most affected hallux right.  All other systems are negative.  Nails were filed thin. Laser therapy was administered to 1st toenails right and patient tolerated the treatment well. All safety precautions were in place.   Patient has completed the recommended laser treatments. He will follow up with Dr. Paulla Dolly in  months to evaluate progress.

## 2021-04-27 ENCOUNTER — Other Ambulatory Visit: Payer: Self-pay

## 2021-04-27 ENCOUNTER — Ambulatory Visit
Admission: RE | Admit: 2021-04-27 | Discharge: 2021-04-27 | Disposition: A | Discharge status: Home / Self Care | Payer: BC Managed Care – PPO | Source: Ambulatory Visit | Attending: Family Medicine | Admitting: Family Medicine

## 2021-04-27 DIAGNOSIS — S8292XA Unspecified fracture of left lower leg, initial encounter for closed fracture: Secondary | ICD-10-CM

## 2021-04-27 DIAGNOSIS — M810 Age-related osteoporosis without current pathological fracture: Secondary | ICD-10-CM | POA: Diagnosis not present

## 2021-04-28 DIAGNOSIS — L820 Inflamed seborrheic keratosis: Secondary | ICD-10-CM | POA: Diagnosis not present

## 2021-04-28 DIAGNOSIS — L570 Actinic keratosis: Secondary | ICD-10-CM | POA: Diagnosis not present

## 2021-04-28 DIAGNOSIS — D229 Melanocytic nevi, unspecified: Secondary | ICD-10-CM | POA: Diagnosis not present

## 2021-04-28 DIAGNOSIS — L814 Other melanin hyperpigmentation: Secondary | ICD-10-CM | POA: Diagnosis not present

## 2021-05-06 DIAGNOSIS — Z6820 Body mass index (BMI) 20.0-20.9, adult: Secondary | ICD-10-CM | POA: Diagnosis not present

## 2021-05-06 DIAGNOSIS — Z01419 Encounter for gynecological examination (general) (routine) without abnormal findings: Secondary | ICD-10-CM | POA: Diagnosis not present

## 2021-05-06 DIAGNOSIS — Z780 Asymptomatic menopausal state: Secondary | ICD-10-CM | POA: Diagnosis not present

## 2021-05-12 DIAGNOSIS — M79672 Pain in left foot: Secondary | ICD-10-CM | POA: Diagnosis not present

## 2021-05-12 DIAGNOSIS — M25572 Pain in left ankle and joints of left foot: Secondary | ICD-10-CM | POA: Diagnosis not present

## 2021-05-13 DIAGNOSIS — M898X9 Other specified disorders of bone, unspecified site: Secondary | ICD-10-CM | POA: Diagnosis not present

## 2021-05-13 DIAGNOSIS — M8080XD Other osteoporosis with current pathological fracture, unspecified site, subsequent encounter for fracture with routine healing: Secondary | ICD-10-CM | POA: Diagnosis not present

## 2021-05-25 DIAGNOSIS — K219 Gastro-esophageal reflux disease without esophagitis: Secondary | ICD-10-CM | POA: Diagnosis not present

## 2021-05-25 DIAGNOSIS — L299 Pruritus, unspecified: Secondary | ICD-10-CM | POA: Diagnosis not present

## 2021-05-25 DIAGNOSIS — H938X3 Other specified disorders of ear, bilateral: Secondary | ICD-10-CM | POA: Diagnosis not present

## 2021-05-25 DIAGNOSIS — J3089 Other allergic rhinitis: Secondary | ICD-10-CM | POA: Diagnosis not present

## 2021-05-29 DIAGNOSIS — M810 Age-related osteoporosis without current pathological fracture: Secondary | ICD-10-CM | POA: Diagnosis not present

## 2021-05-29 DIAGNOSIS — R5383 Other fatigue: Secondary | ICD-10-CM | POA: Diagnosis not present

## 2021-05-29 DIAGNOSIS — E559 Vitamin D deficiency, unspecified: Secondary | ICD-10-CM | POA: Diagnosis not present

## 2021-06-03 DIAGNOSIS — E559 Vitamin D deficiency, unspecified: Secondary | ICD-10-CM | POA: Diagnosis not present

## 2021-06-03 DIAGNOSIS — M810 Age-related osteoporosis without current pathological fracture: Secondary | ICD-10-CM | POA: Diagnosis not present

## 2021-06-04 ENCOUNTER — Encounter: Payer: Self-pay | Admitting: Family Medicine

## 2021-06-04 ENCOUNTER — Ambulatory Visit (INDEPENDENT_AMBULATORY_CARE_PROVIDER_SITE_OTHER): Payer: BC Managed Care – PPO | Admitting: Family Medicine

## 2021-06-04 VITALS — BP 90/60 | Ht 61.0 in | Wt 115.0 lb

## 2021-06-04 DIAGNOSIS — M8000XA Age-related osteoporosis with current pathological fracture, unspecified site, initial encounter for fracture: Secondary | ICD-10-CM | POA: Insufficient documentation

## 2021-06-04 DIAGNOSIS — M8080XA Other osteoporosis with current pathological fracture, unspecified site, initial encounter for fracture: Secondary | ICD-10-CM | POA: Diagnosis not present

## 2021-06-04 NOTE — Progress Notes (Signed)
  Carol [name] - 50 y.o. female MRN [id number]  Date of birth: May 25, 1971  SUBJECTIVE:  Including CC & ROS.  No chief complaint on file.   Carol [name] is a 50 y.o. female that is presenting with osteoporosis and delayed healing of an ORIF of a comminuted distal tibial and fibular fractures.  Has a history of melanoma but no chemotherapy or radiation.  She was started on Fosamax but had a significant reaction with arthralgias.  She is adopted so unsure of family history.   Review of Systems See HPI   HISTORY: Past Medical, Surgical, Social, and Family History Reviewed & Updated per EMR.   Pertinent Historical Findings include:  Past Medical History:  Diagnosis Date   Cancer ([place])    skin, basil, squamaous and melanoma, Dr. Asencion Partridge [place]   Complication of anesthesia    pt states woke up during procedure     Past Surgical History:  Procedure Laterality Date   BREAST SURGERY     nov 2014 breast implant DR Stephanie Coup   COLONOSCOPY     x2, polyps resected   COLONOSCOPY WITH PROPOFOL N/A 03/24/2015   Procedure: COLONOSCOPY WITH PROPOFOL;  Surgeon: Garlan Fair, MD;  Location: [facility];  Service: Endoscopy;  Laterality: N/A;   OPEN REDUCTION INTERNAL FIXATION (ORIF) TIBIA/FIBULA FRACTURE Left 04/04/2021   Procedure: OPEN REDUCTION INTERNAL FIXATION (ORIF) TIBIA/FIBULA FRACTURE;  Surgeon: Armond Hang, MD;  Location: [place];  Service: Orthopedics;  Laterality: Left;   TONSILLECTOMY     WISDOM TOOTH EXTRACTION      Family History  Adopted: Yes  Problem Relation Age of Onset   Cancer Mother        colon    Social History   Socioeconomic History   Marital status: Married    Spouse name: Not on file   Number of children: Not on file   Years of education: Not on file   Highest education level: Not on file  Occupational History   Not on file  Tobacco Use   Smoking status: Never   Smokeless tobacco: Never  Vaping Use   Vaping Use: Never used  Substance and  Sexual Activity   Alcohol use: Yes    Comment: socially   Drug use: No   Sexual activity: Not on file  Other Topics Concern   Not on file  Social History Narrative   Not on file   Social Determinants of Health   Financial Resource Strain: Not on file  Food Insecurity: Not on file  Transportation Needs: Not on file  Physical Activity: Not on file  Stress: Not on file  Social Connections: Not on file  Intimate Partner Violence: Not on file     PHYSICAL EXAM:  VS: BP 90/60 (BP Location: Left Arm, Patient Position: Sitting)   Ht 5\' 1"  (1.549 m)   Wt 115 lb (52.2 kg)   LMP 07/31/2018   BMI 21.73 kg/m  Physical Exam Gen: NAD, alert, cooperative with exam, well-appearing     ASSESSMENT & PLAN:   Osteoporosis with current pathological fracture She is presenting with osteoporosis in the setting of a delayed healing ORIF of a distal tibial and fibular fracture of low trauma with adverse reaction to Fosamax. -Counseled on home exercise therapy and supportive care. -She will continue vitamin D and K2. -Pursue Evenity with anabolic effect to avoid permanent impairment given her age we want to treat aggressively.

## 2021-06-04 NOTE — Assessment & Plan Note (Addendum)
She is presenting with osteoporosis in the setting of a delayed healing ORIF of a distal tibial and fibular fracture of low trauma with adverse reaction to Fosamax. -Counseled on home exercise therapy and supportive care. -She will continue vitamin D and K2. -Pursue Evenity with anabolic effect to avoid permanent impairment given her age we want to treat aggressively.

## 2021-06-04 NOTE — Patient Instructions (Signed)
Nice to meet you We will call you with the benefits once we hear back from the insurance   Please send me a message in [place] with any questions or updates. .   --Dr. Raeford Razor

## 2021-06-05 DIAGNOSIS — Z1231 Encounter for screening mammogram for malignant neoplasm of breast: Secondary | ICD-10-CM | POA: Diagnosis not present

## 2021-06-07 ENCOUNTER — Telehealth: Payer: Self-pay

## 2021-06-07 NOTE — Telephone Encounter (Signed)
Evenity VOB initiated via [email or web address]  Last OV:  Next OV:  Last Evenity inj: NEW START Next Evenity inj DUE:

## 2021-06-11 NOTE — Telephone Encounter (Signed)
Prior Auth required for [employer]  PA PROCESS DETAILS: PA is required and is currently not on file. Please call Medical Review at [phone] or visit [email or web address] to initiate PA.

## 2021-06-15 NOTE — Telephone Encounter (Signed)
PA Initiated via [email or web address]  KEY: [id number]

## 2021-06-25 DIAGNOSIS — S8262XD Displaced fracture of lateral malleolus of left fibula, subsequent encounter for closed fracture with routine healing: Secondary | ICD-10-CM | POA: Diagnosis not present

## 2021-06-25 DIAGNOSIS — M79662 Pain in left lower leg: Secondary | ICD-10-CM | POA: Diagnosis not present

## 2021-06-25 DIAGNOSIS — M25572 Pain in left ankle and joints of left foot: Secondary | ICD-10-CM | POA: Diagnosis not present

## 2021-06-25 DIAGNOSIS — S82872D Displaced pilon fracture of left tibia, subsequent encounter for closed fracture with routine healing: Secondary | ICD-10-CM | POA: Diagnosis not present

## 2021-07-03 ENCOUNTER — Telehealth: Payer: Self-pay | Admitting: Family Medicine

## 2021-07-03 NOTE — Telephone Encounter (Signed)
Answered her questions.   Rosemarie Ax, MD [facility] 07/03/2021, 1:17 PM

## 2021-07-10 NOTE — Telephone Encounter (Signed)
Per [place] prior [place].

## 2021-07-10 NOTE — Telephone Encounter (Addendum)
Pt ready for scheduling on or after 07/03/21 COPAY ENROLLMENT complete (see below)  Out-of-pocket cost due at time of visit: $443 Out-of-pocket cost due at time of visit if using Amgen Copay Assistance Card: $25  Primary: [place] co-insurance: 20% (approximately $418) Admin fee co-insurance: 20% (approximately $25)  Secondary: n/a Evenity co-insurance:  Admin fee co-insurance:   Deductible: $4.72 of $700 met  Prior Auth: not required Transaction ID: [id number] Valid:    ** This summary of benefits is an estimation of the patient's out-of-pocket cost. Exact cost may vary based on individual plan coverage.     Copay enrollment

## 2021-07-17 NOTE — Telephone Encounter (Signed)
Pt informed of below.   She states she is wanting to hold off on starting Evenity at this point. She states she is looking to change jobs and will have different insurance. She is concerned this may affect her Amgen assistance eligibility.  I informed her unless she switches to a government funded plan like Medicare, Medicaid, her eligibility should not change. She will let us know when she decides to proceed with Evenity.   I called Amgen Assist copay program @ [phone] and spoke with Legrand Como, he states most likely the patient will still be eligible for the Amgen assistance program with the exact same benefits. The only question will be if her new plan will still cover Evenity.

## 2021-07-17 NOTE — Telephone Encounter (Signed)
Patient successfully enrolled in [place] card program. They have provided her $8,000 on the card to go toward the co-insurance. She will owe $25 each month for Evenity injections. She will need to provide her EOBs when she receives them..  Left message for patient to call back to inform her of this and see if she is ready to proceed with Evenity.

## 2021-07-31 DIAGNOSIS — M25572 Pain in left ankle and joints of left foot: Secondary | ICD-10-CM | POA: Diagnosis not present

## 2021-08-04 DIAGNOSIS — M25572 Pain in left ankle and joints of left foot: Secondary | ICD-10-CM | POA: Diagnosis not present

## 2021-08-06 DIAGNOSIS — S8262XD Displaced fracture of lateral malleolus of left fibula, subsequent encounter for closed fracture with routine healing: Secondary | ICD-10-CM | POA: Diagnosis not present

## 2021-08-06 DIAGNOSIS — S82872D Displaced pilon fracture of left tibia, subsequent encounter for closed fracture with routine healing: Secondary | ICD-10-CM | POA: Diagnosis not present

## 2021-08-06 DIAGNOSIS — M25572 Pain in left ankle and joints of left foot: Secondary | ICD-10-CM | POA: Diagnosis not present

## 2021-08-11 DIAGNOSIS — M25572 Pain in left ankle and joints of left foot: Secondary | ICD-10-CM | POA: Diagnosis not present

## 2021-08-14 DIAGNOSIS — M25572 Pain in left ankle and joints of left foot: Secondary | ICD-10-CM | POA: Diagnosis not present

## 2021-08-17 ENCOUNTER — Telehealth: Payer: Self-pay | Admitting: Family Medicine

## 2021-08-17 NOTE — Telephone Encounter (Signed)
Patient called states wishes to discuss pursuing Evenity injections, please call her at [phone] either provider or nurse.  -Forwarding request.  -glh

## 2021-08-18 DIAGNOSIS — M25572 Pain in left ankle and joints of left foot: Secondary | ICD-10-CM | POA: Diagnosis not present

## 2021-08-19 ENCOUNTER — Telehealth: Payer: Self-pay | Admitting: Family Medicine

## 2021-08-20 DIAGNOSIS — M25572 Pain in left ankle and joints of left foot: Secondary | ICD-10-CM | POA: Diagnosis not present

## 2021-08-26 DIAGNOSIS — M25572 Pain in left ankle and joints of left foot: Secondary | ICD-10-CM | POA: Diagnosis not present

## 2021-08-26 NOTE — Telephone Encounter (Signed)
Patient called states wishes to discuss pursuing Evenity injections, please call her at [phone] either provider or nurse. ?  ?-Forwarding request. ?  ?-glh ?

## 2021-08-27 ENCOUNTER — Ambulatory Visit (INDEPENDENT_AMBULATORY_CARE_PROVIDER_SITE_OTHER): Payer: BC Managed Care – PPO | Admitting: Podiatry

## 2021-08-27 ENCOUNTER — Other Ambulatory Visit: Payer: Self-pay

## 2021-08-27 DIAGNOSIS — B351 Tinea unguium: Secondary | ICD-10-CM | POA: Diagnosis not present

## 2021-08-27 DIAGNOSIS — M25572 Pain in left ankle and joints of left foot: Secondary | ICD-10-CM | POA: Diagnosis not present

## 2021-08-27 MED ORDER — TERBINAFINE HCL 250 MG PO TABS: ORAL_TABLET | ORAL | 0 refills | Status: AC

## 2021-08-27 NOTE — Telephone Encounter (Signed)
I called pt- she does not have her new insurance yet. She spoke with Amgen assist and she feels like she thinks she is still ok to proceed with Evenity.  ? ?She states Fosamax caused joint pain and she feels like she has lost grip strength both in her hands. She is requesting to speak with Dr. Raeford Razor before she starts Evenity. She mentioned having labs.  ?

## 2021-08-28 NOTE — Progress Notes (Signed)
Subjective:  ? ?Patient ID: Carol [name], female   DOB: 51 y.o.   MRN: [id number]  ? ?HPI ?Patient states she has had a reoccurrence of superficial fungus on her nailbeds and states that it worked well what we did last year ? ? ?ROS ? ? ?   ?Objective:  ?Physical Exam  ?Neurovascular status intact with patient found to have superficial discoloration of numerous nailbeds on both feet localized with no erythema edema associated with this F2 ? ?   ?Assessment:  ?Reoccurrence of mycotic nail infection bilateral low-grade involving all nails with the hallux being the worst  ? ?   ?Plan:  ?Reviewed condition and recommended probable to lasers and a pulse antifungal treatment which was prescribed today.  I did allow her to pay $50 a month on this and did schedule her to have 2 laser procedures done ?   ? ? ?

## 2021-08-31 NOTE — Telephone Encounter (Signed)
Discussed her hand pain and weakness. Would have her follow up to evaluate.  ? ?Rosemarie Ax, MD ?[facility] ?08/31/2021, 5:09 PM ? ?

## 2021-09-01 DIAGNOSIS — M25572 Pain in left ankle and joints of left foot: Secondary | ICD-10-CM | POA: Diagnosis not present

## 2021-09-02 ENCOUNTER — Ambulatory Visit: Payer: Self-pay

## 2021-09-02 ENCOUNTER — Ambulatory Visit (HOSPITAL_BASED_OUTPATIENT_CLINIC_OR_DEPARTMENT_OTHER)
Admission: RE | Admit: 2021-09-02 | Discharge: 2021-09-02 | Disposition: A | Discharge status: Home / Self Care | Payer: BC Managed Care – PPO | Source: Ambulatory Visit | Attending: Family Medicine | Admitting: Family Medicine

## 2021-09-02 ENCOUNTER — Other Ambulatory Visit: Payer: Self-pay

## 2021-09-02 ENCOUNTER — Ambulatory Visit (INDEPENDENT_AMBULATORY_CARE_PROVIDER_SITE_OTHER): Payer: BC Managed Care – PPO | Admitting: Family Medicine

## 2021-09-02 ENCOUNTER — Encounter: Payer: Self-pay | Admitting: Family Medicine

## 2021-09-02 VITALS — BP 94/62 | Ht 61.0 in | Wt 120.0 lb

## 2021-09-02 DIAGNOSIS — M25541 Pain in joints of right hand: Secondary | ICD-10-CM | POA: Insufficient documentation

## 2021-09-02 DIAGNOSIS — M79641 Pain in right hand: Secondary | ICD-10-CM

## 2021-09-02 DIAGNOSIS — M25542 Pain in joints of left hand: Secondary | ICD-10-CM | POA: Diagnosis not present

## 2021-09-02 DIAGNOSIS — M79642 Pain in left hand: Secondary | ICD-10-CM | POA: Diagnosis not present

## 2021-09-02 DIAGNOSIS — M8080XG Other osteoporosis with current pathological fracture, unspecified site, subsequent encounter for fracture with delayed healing: Secondary | ICD-10-CM

## 2021-09-02 NOTE — Progress Notes (Signed)
?  Carol [name] - 51 y.o. female MRN [id number]  Date of birth: Jan 05, 1971 ? ?SUBJECTIVE:  Including CC & ROS.  ?No chief complaint on file. ? ? ?Carol [name] is a 51 y.o. female that is presenting with acute on chronic bilateral hand pain.  She still has pain in her wrist joints as well as in the MCP joints.  She also feels like she has weakness in her hands.  She attributes her symptoms to when she first started alendronate.  Unknown family history.  Her symptoms seem to be ongoing. ? ? ? ?Review of Systems ?See HPI  ? ?HISTORY: Past Medical, Surgical, Social, and Family History Reviewed & Updated per EMR.   ?Pertinent Historical Findings include: ? ?Past Medical History:  ?Diagnosis Date  ? Cancer [facility])   ? skin, basil, squamaous and melanoma, Dr. Asencion Partridge [place]  ? Complication of anesthesia   ? pt states woke up during procedure   ? ? ?Past Surgical History:  ?Procedure Laterality Date  ? BREAST SURGERY    ? nov 2014 breast implant DR Stephanie Coup  ? COLONOSCOPY    ? x2, polyps resected  ? COLONOSCOPY WITH PROPOFOL N/A 03/24/2015  ? Procedure: COLONOSCOPY WITH PROPOFOL;  Surgeon: Garlan Fair, MD;  Location: [facility];  Service: Endoscopy;  Laterality: N/A;  ? OPEN REDUCTION INTERNAL FIXATION (ORIF) TIBIA/FIBULA FRACTURE Left 04/04/2021  ? Procedure: OPEN REDUCTION INTERNAL FIXATION (ORIF) TIBIA/FIBULA FRACTURE;  Surgeon: Armond Hang, MD;  Location: [facility];  Service: Orthopedics;  Laterality: Left;  ? TONSILLECTOMY    ? WISDOM TOOTH EXTRACTION    ? ? ? ?PHYSICAL EXAM:  ?VS: BP 94/62 (BP Location: Left Arm, Patient Position: Sitting)   Ht '5\' 1"'$  (1.549 m)   Wt 120 lb (54.4 kg)   LMP 07/31/2018   BMI 22.67 kg/m?  ?Physical Exam ?Gen: NAD, alert, cooperative with exam, well-appearing ?MSK:  ?Neurovascularly intact   ? ?Limited ultrasound: Right wrist and hand: ? ?There appears to be an effusion within the carpal joints and degenerative changes. ?No changes of the scaphoid. ?Normal-appearing CMC  joint. ?No synovitis within the second MCP joint ? ?Summary: Findings most consistent with degenerative changes of the carpal joints. ? ?Ultrasound and interpretation by Clearance Coots, MD ? ? ? ?ASSESSMENT & PLAN:  ? ?Arthralgia of both hands ?Acutely occurring.  She reports her symptoms started after initiating alendronate.  She had since stopped the medication. ?-Counseled on home exercise therapy and supportive care. ?-Uric acid, ANA panel, sed rate and CRP. ?-X-ray of the right and left hand: ?-Could consider injection or physical therapy  ? ? ?Osteoporosis with current pathological fracture ?Reports having arthralgias after starting osteoporosis medication.  Concern for possible bony turnover. ?-Check C-telopeptide and pro peptide type I ? ? ? ? ?

## 2021-09-02 NOTE — Patient Instructions (Signed)
Good to see you ?I will call with the results from today   ?Please send me a message in MyChart with any questions or updates.  ?Please see me back depend on work up.  ? ?--Dr. Raeford Razor ? ?

## 2021-09-02 NOTE — Assessment & Plan Note (Signed)
Reports having arthralgias after starting osteoporosis medication.  Concern for possible bony turnover. ?-Check C-telopeptide and pro peptide type I ?

## 2021-09-02 NOTE — Assessment & Plan Note (Signed)
Acutely occurring.  She reports her symptoms started after initiating alendronate.  She had since stopped the medication. ?-Counseled on home exercise therapy and supportive care. ?-Uric acid, ANA panel, sed rate and CRP. ?-X-ray of the right and left hand: ?-Could consider injection or physical therapy  ? ?

## 2021-09-03 ENCOUNTER — Telehealth: Payer: Self-pay | Admitting: Family Medicine

## 2021-09-03 DIAGNOSIS — M25572 Pain in left ankle and joints of left foot: Secondary | ICD-10-CM | POA: Diagnosis not present

## 2021-09-03 NOTE — Telephone Encounter (Signed)
Informed of xray results.  ? ?Rosemarie Ax, MD ?[facility] ?09/03/2021, 11:12 AM ? ?

## 2021-09-08 ENCOUNTER — Other Ambulatory Visit: Payer: BC Managed Care – PPO

## 2021-09-08 ENCOUNTER — Other Ambulatory Visit: Payer: Self-pay

## 2021-09-08 ENCOUNTER — Ambulatory Visit (INDEPENDENT_AMBULATORY_CARE_PROVIDER_SITE_OTHER): Payer: BC Managed Care – PPO

## 2021-09-08 DIAGNOSIS — B351 Tinea unguium: Secondary | ICD-10-CM

## 2021-09-08 DIAGNOSIS — M25572 Pain in left ankle and joints of left foot: Secondary | ICD-10-CM | POA: Diagnosis not present

## 2021-09-08 LAB — URIC ACID: Uric Acid: 3.8 mg/dL (ref 3.0–7.2)

## 2021-09-08 LAB — ANA,IFA RA DIAG PNL W/RFLX TIT/PATN
ANA Titer 1: NEGATIVE
Cyclic Citrullin Peptide Ab: 18 units (ref 0–19)
Rheumatoid fact SerPl-aCnc: <10 IU/mL (ref <14.0)

## 2021-09-08 LAB — C-REACTIVE PROTEIN: CRP: <1 mg/L (ref 0–10)

## 2021-09-08 LAB — SEDIMENTATION RATE: Sed Rate: 7 mm/hr (ref 0–40)

## 2021-09-08 LAB — PROPEPTIDE TYPE I COLLAGEN: Propeptide Type I Collagen: 107 ug/L

## 2021-09-08 NOTE — Progress Notes (Signed)
Patient presents today for the 1st laser treatment. Diagnosed with mycotic nail infection by Dr. Paulla Dolly.  ? ?Toenail most affected hallux right. ? ?All other systems are negative. ? ?Nails were filed thin. Laser therapy was administered to 1st toenails right and patient tolerated the treatment well. All safety precautions were in place.  ? ?Follow up in 6 weeks for laser # 2. ?  ?Dr. Paulla Dolly advised patient that they would need 1-2 laser treatments along with the oral terbinafine medication pulse dose. ?

## 2021-09-08 NOTE — Patient Instructions (Signed)

## 2021-09-10 ENCOUNTER — Telehealth: Payer: Self-pay | Admitting: Family Medicine

## 2021-09-10 DIAGNOSIS — M25572 Pain in left ankle and joints of left foot: Secondary | ICD-10-CM | POA: Diagnosis not present

## 2021-09-10 LAB — C-TERMINAL TELOPEPTIDE: C-Telopeptide (CTx): 295 pg/mL

## 2021-09-10 NOTE — Telephone Encounter (Signed)
Informed of results.  

## 2021-09-15 DIAGNOSIS — M25572 Pain in left ankle and joints of left foot: Secondary | ICD-10-CM | POA: Diagnosis not present

## 2021-09-17 DIAGNOSIS — M25572 Pain in left ankle and joints of left foot: Secondary | ICD-10-CM | POA: Diagnosis not present

## 2021-09-22 DIAGNOSIS — M25572 Pain in left ankle and joints of left foot: Secondary | ICD-10-CM | POA: Diagnosis not present

## 2021-09-24 DIAGNOSIS — M25572 Pain in left ankle and joints of left foot: Secondary | ICD-10-CM | POA: Diagnosis not present

## 2021-10-05 ENCOUNTER — Ambulatory Visit: Payer: BC Managed Care – PPO | Admitting: *Deleted

## 2021-10-05 ENCOUNTER — Encounter: Payer: Self-pay | Admitting: *Deleted

## 2021-10-05 DIAGNOSIS — M8080XG Other osteoporosis with current pathological fracture, unspecified site, subsequent encounter for fracture with delayed healing: Secondary | ICD-10-CM

## 2021-10-05 NOTE — Progress Notes (Signed)
Patient is here for her 1st Evenity injection. She received bilateral  arm injections. Pt to schedule her next injection before she leaves today ?

## 2021-10-07 DIAGNOSIS — M25572 Pain in left ankle and joints of left foot: Secondary | ICD-10-CM | POA: Diagnosis not present

## 2021-10-08 DIAGNOSIS — S8262XD Displaced fracture of lateral malleolus of left fibula, subsequent encounter for closed fracture with routine healing: Secondary | ICD-10-CM | POA: Diagnosis not present

## 2021-10-08 DIAGNOSIS — M25572 Pain in left ankle and joints of left foot: Secondary | ICD-10-CM | POA: Diagnosis not present

## 2021-10-08 DIAGNOSIS — S82872D Displaced pilon fracture of left tibia, subsequent encounter for closed fracture with routine healing: Secondary | ICD-10-CM | POA: Diagnosis not present

## 2021-10-09 DIAGNOSIS — M25572 Pain in left ankle and joints of left foot: Secondary | ICD-10-CM | POA: Diagnosis not present

## 2021-10-12 DIAGNOSIS — M25572 Pain in left ankle and joints of left foot: Secondary | ICD-10-CM | POA: Diagnosis not present

## 2021-10-14 DIAGNOSIS — M25572 Pain in left ankle and joints of left foot: Secondary | ICD-10-CM | POA: Diagnosis not present

## 2021-10-20 DIAGNOSIS — M25572 Pain in left ankle and joints of left foot: Secondary | ICD-10-CM | POA: Diagnosis not present

## 2021-10-22 DIAGNOSIS — M25572 Pain in left ankle and joints of left foot: Secondary | ICD-10-CM | POA: Diagnosis not present

## 2021-10-23 ENCOUNTER — Ambulatory Visit (INDEPENDENT_AMBULATORY_CARE_PROVIDER_SITE_OTHER): Payer: Self-pay

## 2021-10-23 DIAGNOSIS — B351 Tinea unguium: Secondary | ICD-10-CM

## 2021-10-23 NOTE — Progress Notes (Signed)
Patient presents today for the 2nd laser treatment. Diagnosed with mycotic nail infection by Dr. Paulla Dolly.  ? ?Toenail most affected hallux right. ? ?All other systems are negative. ? ?Nails were filed thin. Laser therapy was administered to 1st toenails right and patient tolerated the treatment well. All safety precautions were in place.  ? ?Patient has completed the recommended laser treatments. He will follow up with Dr. Paulla Dolly  in 3 months to evaluate progress.    ? ?Dr. Paulla Dolly advised patient that they would need 1-2 laser treatments along with the oral terbinafine medication pulse dose. ? ?Patient has a white calcium-like build up on th nails that is easily removed with the bur. Patient wanted to know where it comes. Advise to follow-up with provider.  ?

## 2021-10-27 DIAGNOSIS — M25572 Pain in left ankle and joints of left foot: Secondary | ICD-10-CM | POA: Diagnosis not present

## 2021-11-03 DIAGNOSIS — L821 Other seborrheic keratosis: Secondary | ICD-10-CM | POA: Diagnosis not present

## 2021-11-03 DIAGNOSIS — Z85828 Personal history of other malignant neoplasm of skin: Secondary | ICD-10-CM | POA: Diagnosis not present

## 2021-11-03 DIAGNOSIS — D1801 Hemangioma of skin and subcutaneous tissue: Secondary | ICD-10-CM | POA: Diagnosis not present

## 2021-11-03 DIAGNOSIS — L820 Inflamed seborrheic keratosis: Secondary | ICD-10-CM | POA: Diagnosis not present

## 2021-11-03 DIAGNOSIS — L738 Other specified follicular disorders: Secondary | ICD-10-CM | POA: Diagnosis not present

## 2021-11-03 DIAGNOSIS — B351 Tinea unguium: Secondary | ICD-10-CM | POA: Diagnosis not present

## 2021-11-03 DIAGNOSIS — L578 Other skin changes due to chronic exposure to nonionizing radiation: Secondary | ICD-10-CM | POA: Diagnosis not present

## 2021-11-03 DIAGNOSIS — D229 Melanocytic nevi, unspecified: Secondary | ICD-10-CM | POA: Diagnosis not present

## 2021-11-03 DIAGNOSIS — Z85820 Personal history of malignant melanoma of skin: Secondary | ICD-10-CM | POA: Diagnosis not present

## 2021-11-03 DIAGNOSIS — L814 Other melanin hyperpigmentation: Secondary | ICD-10-CM | POA: Diagnosis not present

## 2021-11-05 ENCOUNTER — Ambulatory Visit: Payer: BC Managed Care – PPO | Admitting: *Deleted

## 2021-11-05 DIAGNOSIS — M8080XG Other osteoporosis with current pathological fracture, unspecified site, subsequent encounter for fracture with delayed healing: Secondary | ICD-10-CM

## 2021-11-05 NOTE — Progress Notes (Signed)
Patient is here for her 2nd Evenity injection. She received bilateral [place] arm injections. She will return in 1 month for her next Evenity.  ?

## 2021-11-09 ENCOUNTER — Encounter: Payer: Self-pay | Admitting: *Deleted

## 2021-11-10 NOTE — Telephone Encounter (Signed)
Evenity Injection Schedule ?Inj #1 - 11/05/21 ?Inj #2 - scheduled 12/07/21 ?Inj #3  ?Inj #4  ?Inj #5  ?Inj #6  ?Inj #7  ?Inj #8  ?Inj #9  ?Inj #10  ?Inj #11  ?Inj #12  ? ?

## 2021-12-07 ENCOUNTER — Ambulatory Visit: Payer: BC Managed Care – PPO | Admitting: *Deleted

## 2021-12-07 DIAGNOSIS — M8000XA Age-related osteoporosis with current pathological fracture, unspecified site, initial encounter for fracture: Secondary | ICD-10-CM | POA: Diagnosis not present

## 2021-12-07 NOTE — Progress Notes (Signed)
#  3 Evenity '210mg'$  ('105mg'$  in each arm) Amgen [id number] lot 01/26/2024 exp Pt tolerated procedure will. Will return in 31+ days for her 4th injection

## 2022-01-07 ENCOUNTER — Ambulatory Visit: Payer: BC Managed Care – PPO | Admitting: *Deleted

## 2022-01-07 ENCOUNTER — Encounter: Payer: Self-pay | Admitting: *Deleted

## 2022-01-07 DIAGNOSIS — M8000XA Age-related osteoporosis with current pathological fracture, unspecified site, initial encounter for fracture: Secondary | ICD-10-CM | POA: Diagnosis not present

## 2022-01-07 MED ORDER — ROMOSOZUMAB-AQQG 105 MG/1.17ML ~~LOC~~ SOSY
210.0000 mg | PREFILLED_SYRINGE | Freq: Once | SUBCUTANEOUS | Status: AC
  Administered 2022-01-07: 210 mg via SUBCUTANEOUS

## 2022-01-07 NOTE — Progress Notes (Signed)
Patient is here for her 4th Evenity injection. She received bilateral  arm injections. She will return in 1 month for her next Evenity.

## 2022-01-12 ENCOUNTER — Telehealth: Payer: Self-pay | Admitting: Family Medicine

## 2022-01-12 NOTE — Telephone Encounter (Signed)
Can you do a retro PA? The patient has called stating Carol [name] is denied. She also states her secondary will cover it.

## 2022-01-12 NOTE — Telephone Encounter (Signed)
Forwarding message to med asst (Patient) received denial from Ins Co regarding Evenity & ask for provider to call her @ [phone].  --glh

## 2022-01-14 NOTE — Telephone Encounter (Signed)
Will retro active PA and going forward will try sending evenity to pharmacy or do PA going forward.   Rosemarie Ax, MD [facility] 01/14/2022, 8:29 AM

## 2022-01-15 DIAGNOSIS — S82872D Displaced pilon fracture of left tibia, subsequent encounter for closed fracture with routine healing: Secondary | ICD-10-CM | POA: Diagnosis not present

## 2022-01-15 DIAGNOSIS — S8262XD Displaced fracture of lateral malleolus of left fibula, subsequent encounter for closed fracture with routine healing: Secondary | ICD-10-CM | POA: Diagnosis not present

## 2022-01-15 NOTE — Telephone Encounter (Signed)
I called pt- she states she has a new job with new insurance. The new ins does not cover Evenity.  She currently has duel coverage.   Her new insurance will cover Tymlos or Forteo.  She is willing to continue with Evenity and filing with her old insurance if absolutely necessary. She will obtain bills and EOBs and have [place] fax to [place] assist and is sure they will cover it.   OR she wants to know If you think Tymlos or Forteo will be just as good for her and there is no contraindication with her stopping Evenity and starting one of the others. She is trying to cancel coverage with old insurance for cost saving reasons.   She states the original message was completely misconstrued and wants to speak with Dr. Raeford Razor only for his medical opinion on changing therapy. Please call patient and advise.

## 2022-01-18 NOTE — Telephone Encounter (Signed)
Cresenciano Lick, CMA  Rosemarie Ax, MD 3 days ago   The [employer] for Jeremy's return on 7/31. Patient is aware he is out of office.    Cresenciano Lick, CMA 3 days ago   SS I called pt- she states she has a new job with new insurance. The new ins does not cover Evenity.  She currently has duel coverage.    Her new insurance will cover Tymlos or Forteo.  She is willing to continue with Evenity and filing with her old insurance if absolutely necessary. She will obtain bills and EOBs and have [place] fax to [place] assist and is sure they will cover it.    OR she wants to know If you think Tymlos or Forteo will be just as good for her and there is no contraindication with her stopping Evenity and starting one of the others. She is trying to cancel coverage with old insurance for cost saving reasons.    She states the original message was completely misconstrued and wants to speak with Dr. Raeford Razor only for his medical opinion on changing therapy. Please call patient and advise.       Note    Cresenciano Lick, CMA  Bhak[place], [place] [phone] 3 days ago   Rosemarie Ax, MD routed conversation to Cresenciano Lick, CMA 4 days ago   Rosemarie Ax, MD 4 days ago    Will retro active PA and going forward will try sending evenity to pharmacy or do PA going forward.    Rosemarie Ax, MD [facility] 01/14/2022, 8:29 AM        Note    Cresenciano Lick, CMA routed conversation to You 6 days ago   Cresenciano Lick, CMA 6 days ago   SS Can you do a retro PA? The patient has called stating [place] is denied. She also states her secondary will cover it.       Note    Cresenciano Lick, CMA routed conversation to Rosemarie Ax, MD 6 days ago   Elberta Spaniel routed conversation to Cresenciano Lick, [place] 6 days ago   Elberta Spaniel 6 days ago   [place]ar message to med asst (Patient) received denial from [employer] regarding Evenity & ask for provider  to call her @ [phone].   --glh      Note    Daionna, [place] [phone]  Annabell Sabal L 6 days ago

## 2022-01-25 NOTE — Telephone Encounter (Signed)
Left VM for patient. If she calls back please have her speak with a nurse/CMA and inform that we can cosider transitioning to prolia or reclast to help with her osteoporosis.   If any questions then please take the best time and phone number to call and I will try to call her back.   Rosemarie Ax, MD [facility] 01/25/2022, 4:34 PM

## 2022-02-02 NOTE — Telephone Encounter (Addendum)
Evenity Injection Schedule Inj #1 - 11/05/21 Inj #2 - 12/07/21 Inj #3 - 01/07/22 Inj #4 - 02/08/22 Inj #5 - 03/12/22 Inj #6 - 04/12/22 Inj #7 - scheduled 05/17/22 Inj #8  Inj #9  Inj #10  Inj #11  Inj #12

## 2022-02-03 DIAGNOSIS — M7662 Achilles tendinitis, left leg: Secondary | ICD-10-CM | POA: Diagnosis not present

## 2022-02-05 DIAGNOSIS — M7662 Achilles tendinitis, left leg: Secondary | ICD-10-CM | POA: Diagnosis not present

## 2022-02-08 ENCOUNTER — Ambulatory Visit: Payer: BC Managed Care – PPO | Admitting: *Deleted

## 2022-02-08 ENCOUNTER — Encounter: Payer: Self-pay | Admitting: *Deleted

## 2022-02-08 DIAGNOSIS — M8080XG Other osteoporosis with current pathological fracture, unspecified site, subsequent encounter for fracture with delayed healing: Secondary | ICD-10-CM

## 2022-02-08 MED ORDER — ROMOSOZUMAB-AQQG 105 MG/1.17ML ~~LOC~~ SOSY
210.0000 mg | PREFILLED_SYRINGE | Freq: Once | SUBCUTANEOUS | Status: AC
  Administered 2022-02-08: 210 mg via SUBCUTANEOUS

## 2022-02-08 NOTE — Progress Notes (Signed)
Patient is here for her 5th Evenity injection. She received bilateral [place] arm injections. She will return in 1 month for her next Evenity.

## 2022-02-09 DIAGNOSIS — M7662 Achilles tendinitis, left leg: Secondary | ICD-10-CM | POA: Diagnosis not present

## 2022-02-16 DIAGNOSIS — M7662 Achilles tendinitis, left leg: Secondary | ICD-10-CM | POA: Diagnosis not present

## 2022-02-18 DIAGNOSIS — M7662 Achilles tendinitis, left leg: Secondary | ICD-10-CM | POA: Diagnosis not present

## 2022-02-22 DIAGNOSIS — M7662 Achilles tendinitis, left leg: Secondary | ICD-10-CM | POA: Diagnosis not present

## 2022-02-26 DIAGNOSIS — M7662 Achilles tendinitis, left leg: Secondary | ICD-10-CM | POA: Diagnosis not present

## 2022-03-05 DIAGNOSIS — M7662 Achilles tendinitis, left leg: Secondary | ICD-10-CM | POA: Diagnosis not present

## 2022-03-09 DIAGNOSIS — M7662 Achilles tendinitis, left leg: Secondary | ICD-10-CM | POA: Diagnosis not present

## 2022-03-11 DIAGNOSIS — M7662 Achilles tendinitis, left leg: Secondary | ICD-10-CM | POA: Diagnosis not present

## 2022-03-12 ENCOUNTER — Ambulatory Visit: Payer: BC Managed Care – PPO | Admitting: *Deleted

## 2022-03-12 DIAGNOSIS — M8080XG Other osteoporosis with current pathological fracture, unspecified site, subsequent encounter for fracture with delayed healing: Secondary | ICD-10-CM

## 2022-03-12 MED ORDER — ROMOSOZUMAB-AQQG 105 MG/1.17ML ~~LOC~~ SOSY
210.0000 mg | PREFILLED_SYRINGE | Freq: Once | SUBCUTANEOUS | Status: AC
  Administered 2022-03-12: 210 mg via SUBCUTANEOUS

## 2022-03-12 NOTE — Progress Notes (Signed)
Patient is here for her 6th Evenity injection. She received bilateral Am[place] arm injections. She will return in 1 month for her next Evenity.

## 2022-03-16 DIAGNOSIS — M7662 Achilles tendinitis, left leg: Secondary | ICD-10-CM | POA: Diagnosis not present

## 2022-03-18 DIAGNOSIS — M7662 Achilles tendinitis, left leg: Secondary | ICD-10-CM | POA: Diagnosis not present

## 2022-03-22 DIAGNOSIS — M7662 Achilles tendinitis, left leg: Secondary | ICD-10-CM | POA: Diagnosis not present

## 2022-03-25 DIAGNOSIS — M7662 Achilles tendinitis, left leg: Secondary | ICD-10-CM | POA: Diagnosis not present

## 2022-03-30 DIAGNOSIS — M7662 Achilles tendinitis, left leg: Secondary | ICD-10-CM | POA: Diagnosis not present

## 2022-04-01 DIAGNOSIS — M7662 Achilles tendinitis, left leg: Secondary | ICD-10-CM | POA: Diagnosis not present

## 2022-04-05 DIAGNOSIS — L989 Disorder of the skin and subcutaneous tissue, unspecified: Secondary | ICD-10-CM | POA: Diagnosis not present

## 2022-04-05 DIAGNOSIS — K219 Gastro-esophageal reflux disease without esophagitis: Secondary | ICD-10-CM | POA: Diagnosis not present

## 2022-04-05 DIAGNOSIS — M8080XD Other osteoporosis with current pathological fracture, unspecified site, subsequent encounter for fracture with routine healing: Secondary | ICD-10-CM | POA: Diagnosis not present

## 2022-04-05 DIAGNOSIS — Z23 Encounter for immunization: Secondary | ICD-10-CM | POA: Diagnosis not present

## 2022-04-05 DIAGNOSIS — R202 Paresthesia of skin: Secondary | ICD-10-CM | POA: Diagnosis not present

## 2022-04-05 DIAGNOSIS — E7800 Pure hypercholesterolemia, unspecified: Secondary | ICD-10-CM | POA: Diagnosis not present

## 2022-04-05 DIAGNOSIS — Z0000 Encounter for general adult medical examination without abnormal findings: Secondary | ICD-10-CM | POA: Diagnosis not present

## 2022-04-06 DIAGNOSIS — S82872D Displaced pilon fracture of left tibia, subsequent encounter for closed fracture with routine healing: Secondary | ICD-10-CM | POA: Diagnosis not present

## 2022-04-06 DIAGNOSIS — M7662 Achilles tendinitis, left leg: Secondary | ICD-10-CM | POA: Diagnosis not present

## 2022-04-08 ENCOUNTER — Other Ambulatory Visit: Payer: Self-pay | Admitting: Family Medicine

## 2022-04-08 DIAGNOSIS — R0989 Other specified symptoms and signs involving the circulatory and respiratory systems: Secondary | ICD-10-CM

## 2022-04-12 ENCOUNTER — Ambulatory Visit (INDEPENDENT_AMBULATORY_CARE_PROVIDER_SITE_OTHER): Payer: BC Managed Care – PPO | Admitting: *Deleted

## 2022-04-12 ENCOUNTER — Encounter: Payer: Self-pay | Admitting: *Deleted

## 2022-04-12 DIAGNOSIS — M8080XG Other osteoporosis with current pathological fracture, unspecified site, subsequent encounter for fracture with delayed healing: Secondary | ICD-10-CM

## 2022-04-12 MED ORDER — ROMOSOZUMAB-AQQG 105 MG/1.17ML ~~LOC~~ SOSY
210.0000 mg | PREFILLED_SYRINGE | Freq: Once | SUBCUTANEOUS | Status: AC
  Administered 2022-04-12: 210 mg via SUBCUTANEOUS

## 2022-04-12 NOTE — Progress Notes (Signed)
Patient is here for her 7th Evenity injection. She received bilateral [place] arm injections. She will return in 1 month for her next Evenity.

## 2022-04-26 ENCOUNTER — Other Ambulatory Visit: Payer: BC Managed Care – PPO

## 2022-05-14 ENCOUNTER — Telehealth: Payer: Self-pay | Admitting: *Deleted

## 2022-05-14 NOTE — Telephone Encounter (Addendum)
I called pt- she is very concerned and upset about her Evenity treatment (due to unpaid ins claims and cost.) She has found out that she owes [facility] $24,000 for unpaid claims from 02/08/22, 03/12/22 and 04/12/22 for her Evenity injections.  She states her new insurance card effective 01/08/22 was never filed or claims sent.  Theadora Rama, she he is asking [place] to obtain retro PAs and appeals for the denied claims. Can you help? See 04/22/22 ins card under "media" She was also informed by [place] assist that she can no longer utilize the program under her new insurance. Only her husband's plan (see secondary)   Dr. Raeford Razor, She wants to hold off on any future Evenity injections until this is resolved and is requesting to speak to you when you return. Will schedule OV. Please advise.

## 2022-05-14 NOTE — Telephone Encounter (Signed)
-----   Message from Elberta Spaniel sent at 05/12/2022  4:17 PM EST ----- Regarding: Carol [name] wants you to call her She left a msg asking for you to call her..ASAP

## 2022-05-17 ENCOUNTER — Encounter: Payer: Self-pay | Admitting: *Deleted

## 2022-05-17 ENCOUNTER — Ambulatory Visit: Payer: BC Managed Care – PPO

## 2022-05-17 NOTE — Telephone Encounter (Signed)
Carol [name], just a FYI: I called BCBS (pt's new ins)  prior review line @ [phone] and spoke to [employer]. She states since no PAs were previosly obtained for Aug, Sept, Oct, no retro PAs can be initiated.

## 2022-05-19 NOTE — Telephone Encounter (Signed)
Cresenciano Lick, [place] days ago   [employer], just a FYI: I called BCBS (pt's new ins)  prior review line @ [phone] and spoke to [employer]. She states since no PAs were previosly obtained for Aug, Sept, Oct, no retro PAs can be initiated.       Note   Cresenciano Lick, CMA routed conversation to You; Rosemarie Ax, MD5 days ago   Cresenciano Lick, [place] days ago   SS I called pt- she is very concerned and upset about her Evenity treatment (due to unpaid ins claims and cost.) She has found out that she owes [facility] $24,000 for unpaid claims from 02/08/22, 03/12/22 and 04/12/22 for her Evenity injections.  She states her new insurance card effective 01/08/22 was never filed or claims sent.  Theadora Rama, she he is asking [place] to obtain retro PAs and appeals for the denied claims. Can you help? See 04/22/22 ins card under "media" She was also informed by [place] assist that she can no longer utilize the program under her new insurance. Only her husband's plan (see secondary)    Dr. Raeford Razor, She wants to hold off on any future Evenity injections until this is resolved and is requesting to speak to you when you return. Will schedule OV. Please advise.       Note   Cresenciano Lick, CMA  [place], [place] [phone] days ago   Cresenciano Lick, [place] days ago   SS ----- Message from Elberta Spaniel sent at 05/12/2022  4:17 PM EST ----- Regarding: Theone Murdoch wants you to call her She left a msg asking for you to call her..ASAP

## 2022-05-25 DIAGNOSIS — L578 Other skin changes due to chronic exposure to nonionizing radiation: Secondary | ICD-10-CM | POA: Diagnosis not present

## 2022-05-25 DIAGNOSIS — L209 Atopic dermatitis, unspecified: Secondary | ICD-10-CM | POA: Diagnosis not present

## 2022-05-25 DIAGNOSIS — D229 Melanocytic nevi, unspecified: Secondary | ICD-10-CM | POA: Diagnosis not present

## 2022-05-25 DIAGNOSIS — L820 Inflamed seborrheic keratosis: Secondary | ICD-10-CM | POA: Diagnosis not present

## 2022-05-25 DIAGNOSIS — L814 Other melanin hyperpigmentation: Secondary | ICD-10-CM | POA: Diagnosis not present

## 2022-05-25 DIAGNOSIS — L570 Actinic keratosis: Secondary | ICD-10-CM | POA: Diagnosis not present

## 2022-05-25 NOTE — Telephone Encounter (Signed)
I spk w/pt she says do not do any addt'l things to her BCBS issues, she is handling

## 2022-05-26 NOTE — Telephone Encounter (Signed)
I spoke to pt and Carol C.- pt states her new insurance has authorized her 04/12/22 Evenity injection. Everything is covered,except $60 and she is hoping her spouse's plan will cover the $60.  She is hoping the same will happen for her Aug and Sept injections. She wants [place] to hold off on all future auths and communication with her insurance regarding Evenity at this time. She only wants [place] to re-enroll her in the [place] assist program if needed.  Carol C. Is aware and will proceed to re-verify benefits and re-enroll patient in [place] assistance program. Patient is aware and will call me back when she wants to move forward with continuing Evenity treatment.

## 2022-05-27 NOTE — Telephone Encounter (Addendum)
Insurance updated (primary and secondary), re-submitted for VOB.

## 2022-05-27 NOTE — Telephone Encounter (Signed)
[place], spoke with [place].   Levada Dy has confirmed Primary insurance and added secondary insurance.

## 2022-05-31 NOTE — Telephone Encounter (Signed)
Carol [name], CMA5 days ago   SS I spoke to pt and Carol Rama C.- pt states her new insurance has authorized her 04/12/22 Evenity injection. Everything is covered,except $60 and she is hoping her spouse's plan will cover the $60.  She is hoping the same will happen for her Aug and Sept injections. She wants [place] to hold off on all future auths and communication with her insurance regarding Evenity at this time. She only wants [place] to re-enroll her in the [place] assist program if needed.  Carol Fritze C. Is aware and will proceed to re-verify benefits and re-enroll patient in [place] assistance program. Patient is aware and will call me back when she wants to move forward with continuing Evenity treatment.

## 2022-06-09 DIAGNOSIS — Z1231 Encounter for screening mammogram for malignant neoplasm of breast: Secondary | ICD-10-CM | POA: Diagnosis not present

## 2022-06-27 DIAGNOSIS — Z6820 Body mass index (BMI) 20.0-20.9, adult: Secondary | ICD-10-CM | POA: Diagnosis not present

## 2022-06-27 DIAGNOSIS — R059 Cough, unspecified: Secondary | ICD-10-CM | POA: Diagnosis not present

## 2022-07-14 DIAGNOSIS — R922 Inconclusive mammogram: Secondary | ICD-10-CM | POA: Diagnosis not present

## 2022-08-05 NOTE — Telephone Encounter (Signed)
Update: Patient called stating her insurances have mostly covered the August, Sept and Oct 2023 Evenity injections. She is ready to proceed with completing her remaining 6 Evenity injections.   Dr. Raeford Razor, patient wants to make sure that the gap between injections will not hinder her treatment (last injection was 04/12/22). Please advise.   Theadora Rama, Can you please verify 2024 benefits and check PA status for both primary and secondary insurances? Thank you!

## 2022-08-18 ENCOUNTER — Ambulatory Visit
Admission: RE | Admit: 2022-08-18 | Discharge: 2022-08-18 | Disposition: A | Discharge status: Home / Self Care | Payer: BC Managed Care – PPO | Source: Ambulatory Visit | Attending: Family Medicine | Admitting: Family Medicine

## 2022-08-18 DIAGNOSIS — R059 Cough, unspecified: Secondary | ICD-10-CM | POA: Diagnosis not present

## 2022-08-18 DIAGNOSIS — R0989 Other specified symptoms and signs involving the circulatory and respiratory systems: Secondary | ICD-10-CM

## 2022-08-18 NOTE — Telephone Encounter (Signed)
PRIMARY COVERAGE DETAILS: For the primary MD Purchase option, Evenity will be subject to a 20% coinsurance up to a $6250 out of pocket max ($1552.24 met). Once met, coverage increases to 100%. Administration is subject to $60 copay, which do contribute to OOP max. No deductible applies. We have provided in network benefits only.   SECONDARY COVERAGE DETAILS: For the secondary MD Purchase option, the plan coordinates, subject to a $700 deductible ($0 met) and considers remaining expenses at 80% up to a $4000 out of pocket max ($44.41 met). Deductible does not contribute to OOP max    COPAY ENROLLMENT [Your commercially insured patient may be eligible for the [Amgen SupportPlus Co-Pay Program. Please visit [email or web address] for more information? ] [For information on resources, please have your patient contact [place] Program at [phone].]

## 2022-08-18 NOTE — Telephone Encounter (Signed)
Primary BCBS (subscriber ID [id number])  Deductible: does not apply Copay: $60 Co-insurance: 20% ($450.53) coinsurance up to a $6250 out of pocket max ($172.24 met) Prior Auth: REQUIRED        Secondary BCBS (subscriber ID [id number])  Deductible: $0 of $700 met Copay:  OOP Max: $45 of $4,000 met Co-insurance: 20% - considers remaining expenses at 80% up to a $4000 out of pocket max ($44.41 met). Prior Auth: NOT required

## 2022-08-18 NOTE — Telephone Encounter (Signed)
Prior auth initiated via fax with [place] initiated via [employer] with [place]: [id number]

## 2022-08-23 NOTE — Telephone Encounter (Signed)
Rec'd fax from [facility] stating Carol [name] is approved 08/19/22 to 08/19/23. Ref # [id number].

## 2022-08-23 NOTE — Telephone Encounter (Signed)
Primary BCBS [place]   Prior auth for [place] APPROVED Reference # [id number] Valid 08/19/22-08/19/23

## 2022-09-10 NOTE — Telephone Encounter (Signed)
Spoke with patient and discussed benefits for Primary and Secondary insurances. Advised pt that I will call BCBS this afternoon to confirm co-insurance for primary as the 2 VOB received are conflicting. Also discussed copay card information.

## 2022-09-13 NOTE — Telephone Encounter (Signed)
Attempted to contact [place] regarding conflicting patient benefits.    On hold x 20 min, will have to try calling again later.

## 2022-09-17 NOTE — Telephone Encounter (Signed)
Amgen rep stopped by the office yesterday. Discussed discrepancy in VOB with him. He is going to check on this and get back to me.

## 2022-09-23 NOTE — Telephone Encounter (Addendum)
Called BCBS [place] and spoke with [place].   Advised of the following regarding coverage of Evenity for [employer].  If billed with OV, there will be a $60 copay Co-insurance: 0%  If not billed with an office visit, [id number] will be covered at 100%  Prior auth on file  Call ref # [id number]  Pt is using Amgen co-pay assistance and will only be responsible for a $25 copay.   Lets hold off on collecting $25 copay for now, per [facility] from [place], primary policy should cover Evenity injection at 100% when NOT billed with an office visit and with a $60 copay when billed with an office visit.

## 2022-10-05 NOTE — Telephone Encounter (Addendum)
Evenity Injection Schedule Inj #1 - 10/05/21 Inj #2 - 11/05/21 Inj #3 - 12/07/21 Inj #4 - 01/07/22 Inj #5 - 02/08/22 Inj #6 - 03/12/22 Inj #7 - 04/12/22 Inj #8 - 10/06/22 Inj #9 - 11/08/22 Inj #10 - 12/14/22 Inj #11 - 01/18/23 Inj #12 - 02/22/23     Reference # [id number] Valid 08/19/22-08/19/23

## 2022-10-06 ENCOUNTER — Ambulatory Visit (INDEPENDENT_AMBULATORY_CARE_PROVIDER_SITE_OTHER): Payer: BC Managed Care – PPO | Admitting: *Deleted

## 2022-10-06 DIAGNOSIS — M8000XA Age-related osteoporosis with current pathological fracture, unspecified site, initial encounter for fracture: Secondary | ICD-10-CM | POA: Diagnosis not present

## 2022-10-06 MED ORDER — ROMOSOZUMAB-AQQG 105 MG/1.17ML ~~LOC~~ SOSY
210.0000 mg | PREFILLED_SYRINGE | Freq: Once | SUBCUTANEOUS | Status: AC
Start: 2022-10-06 — End: 2022-10-06
  Administered 2022-10-06: 210 mg via SUBCUTANEOUS

## 2022-10-06 NOTE — Progress Notes (Signed)
Patient is here for her 8th Evenity injection. She received bilateral [place] arm injections. She will return in 1 month for her next Evenity.

## 2022-10-11 ENCOUNTER — Encounter: Payer: Self-pay | Admitting: *Deleted

## 2022-10-12 DIAGNOSIS — S82872D Displaced pilon fracture of left tibia, subsequent encounter for closed fracture with routine healing: Secondary | ICD-10-CM | POA: Diagnosis not present

## 2022-10-12 DIAGNOSIS — M7662 Achilles tendinitis, left leg: Secondary | ICD-10-CM | POA: Diagnosis not present

## 2022-10-13 IMAGING — DX DG HAND COMPLETE 3+V*L*
3 series · 3 of 3 positions shown · non-contrast
Comparison: April 27, 2019

CLINICAL DATA: Hand pain.

EXAM:
LEFT HAND - COMPLETE 3+ VIEW

[hand pa]
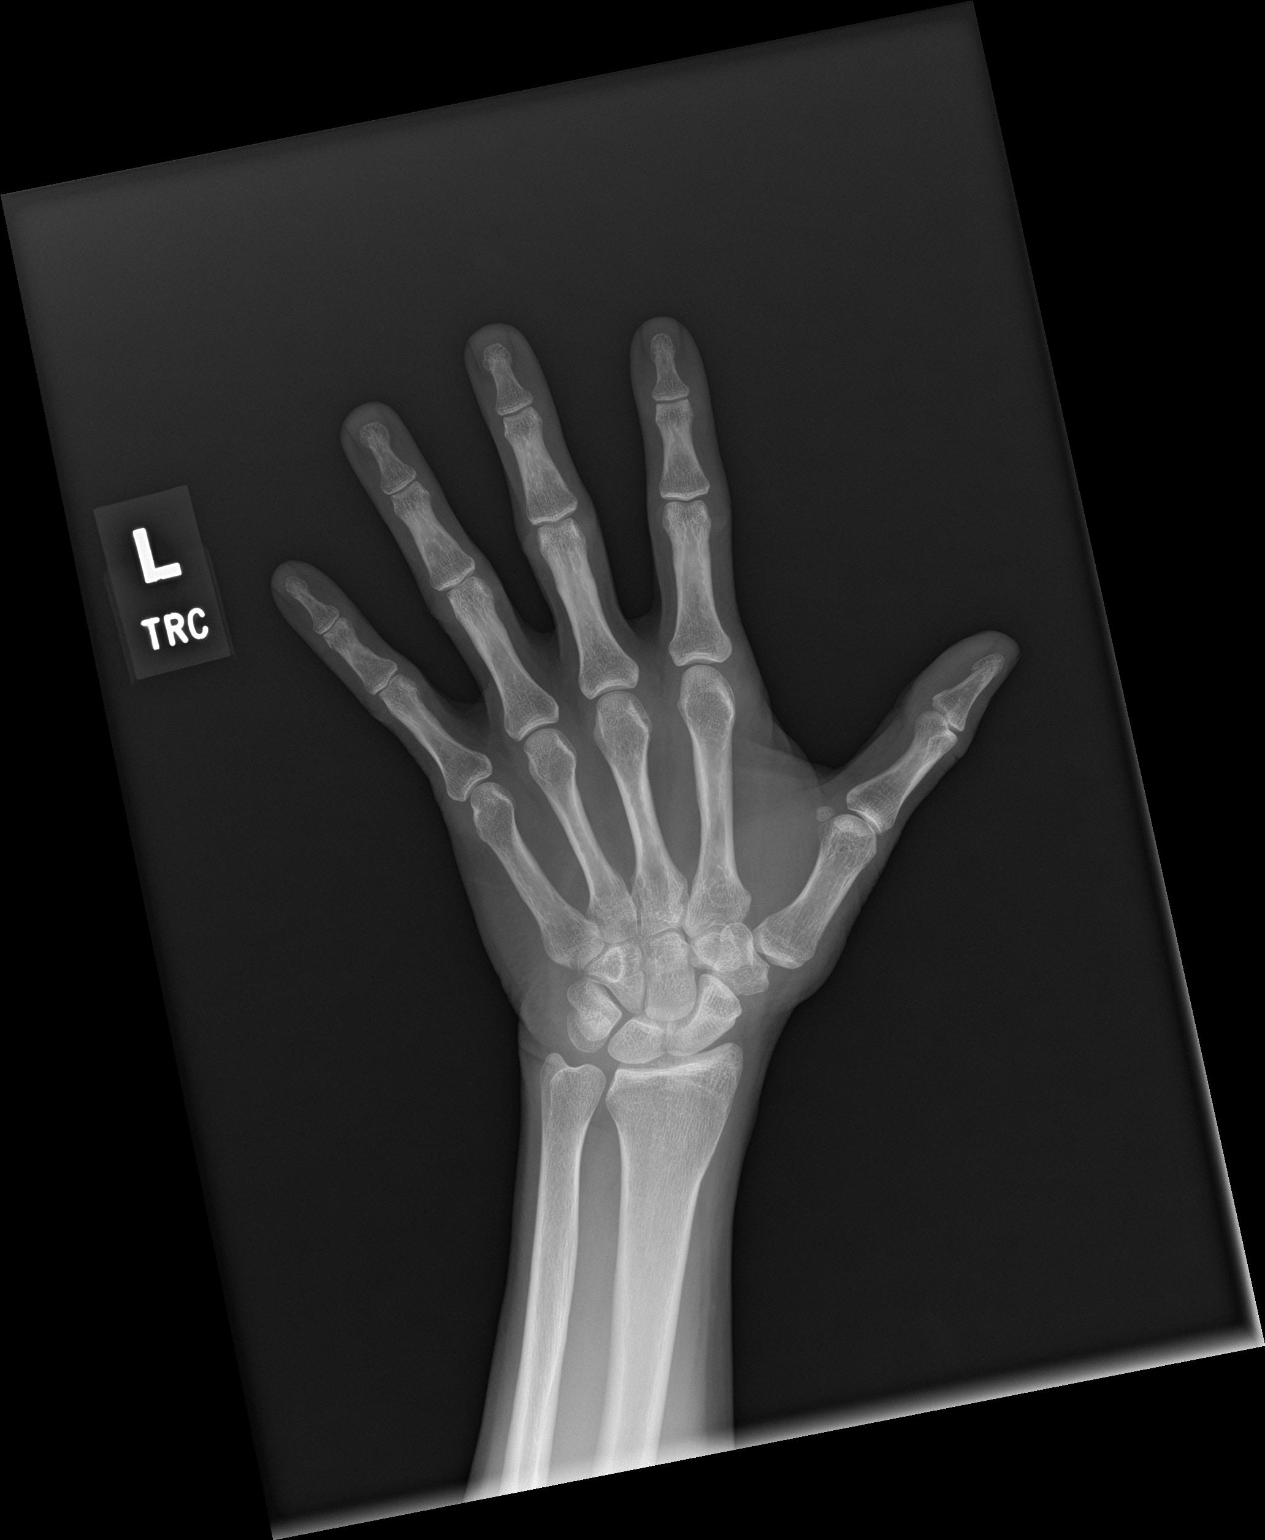

[hand obl]
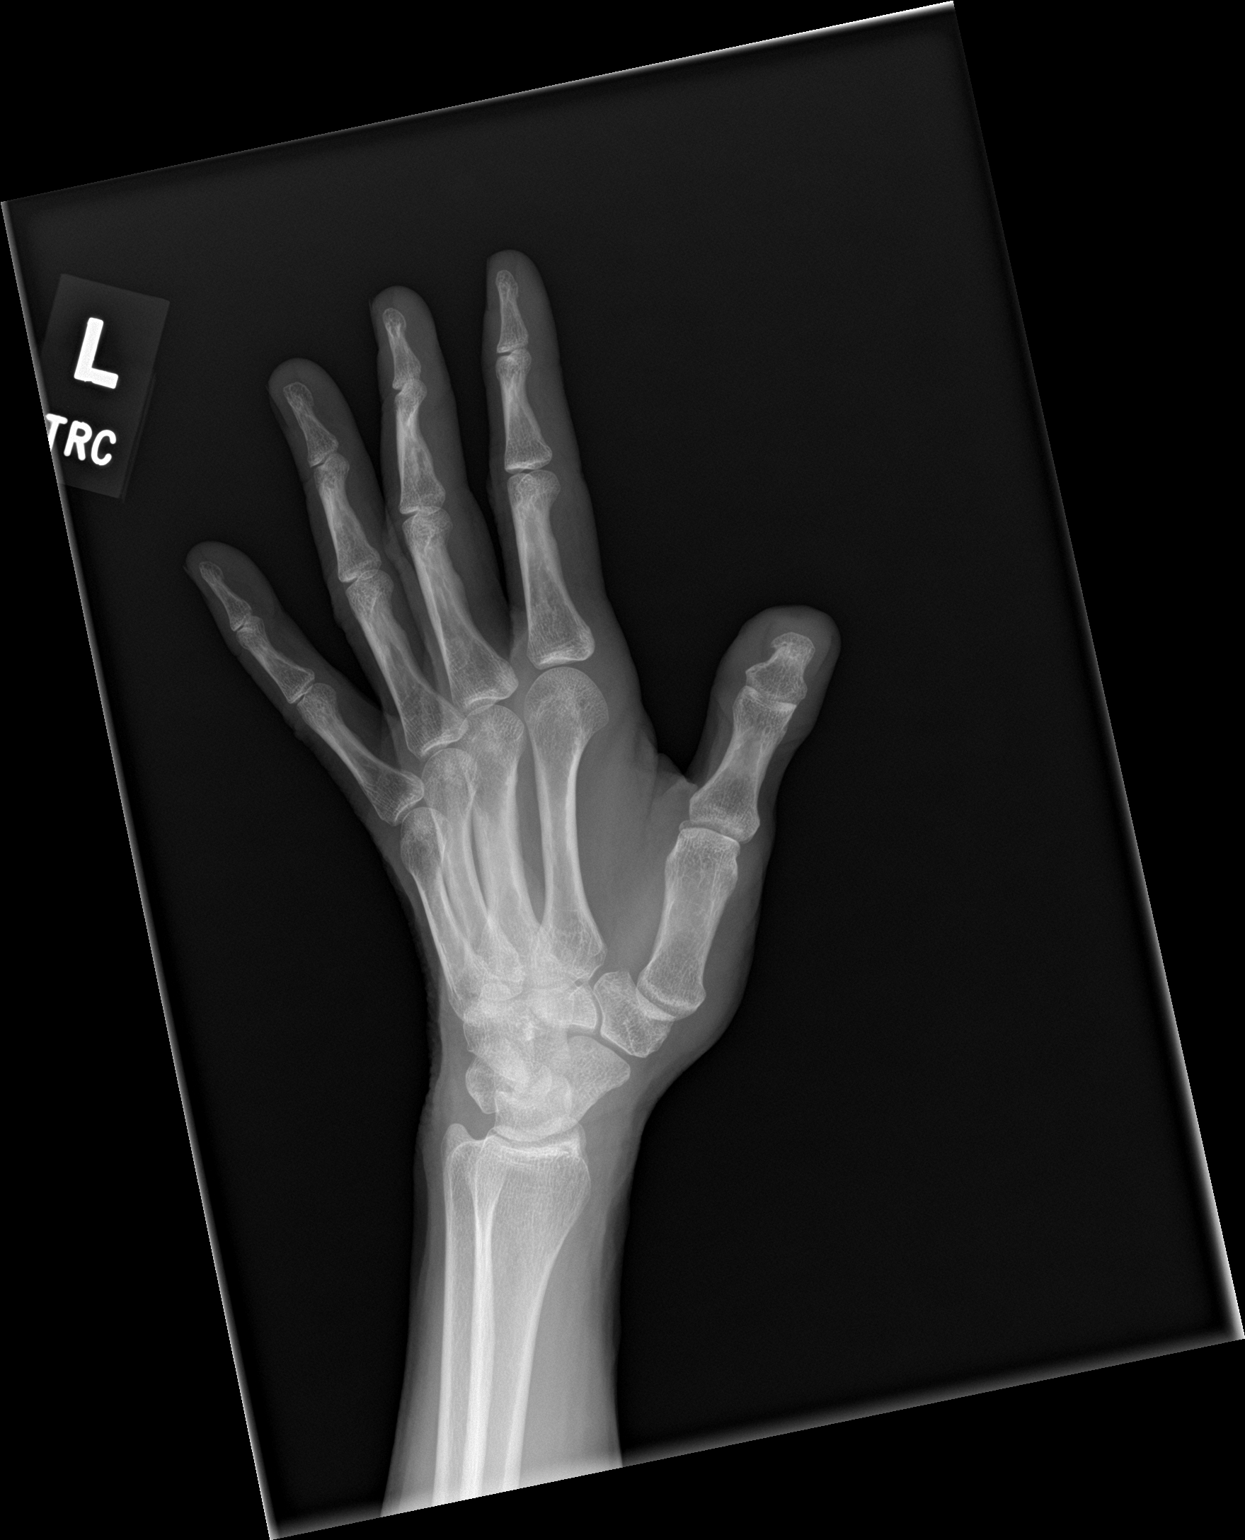

[hand lat]
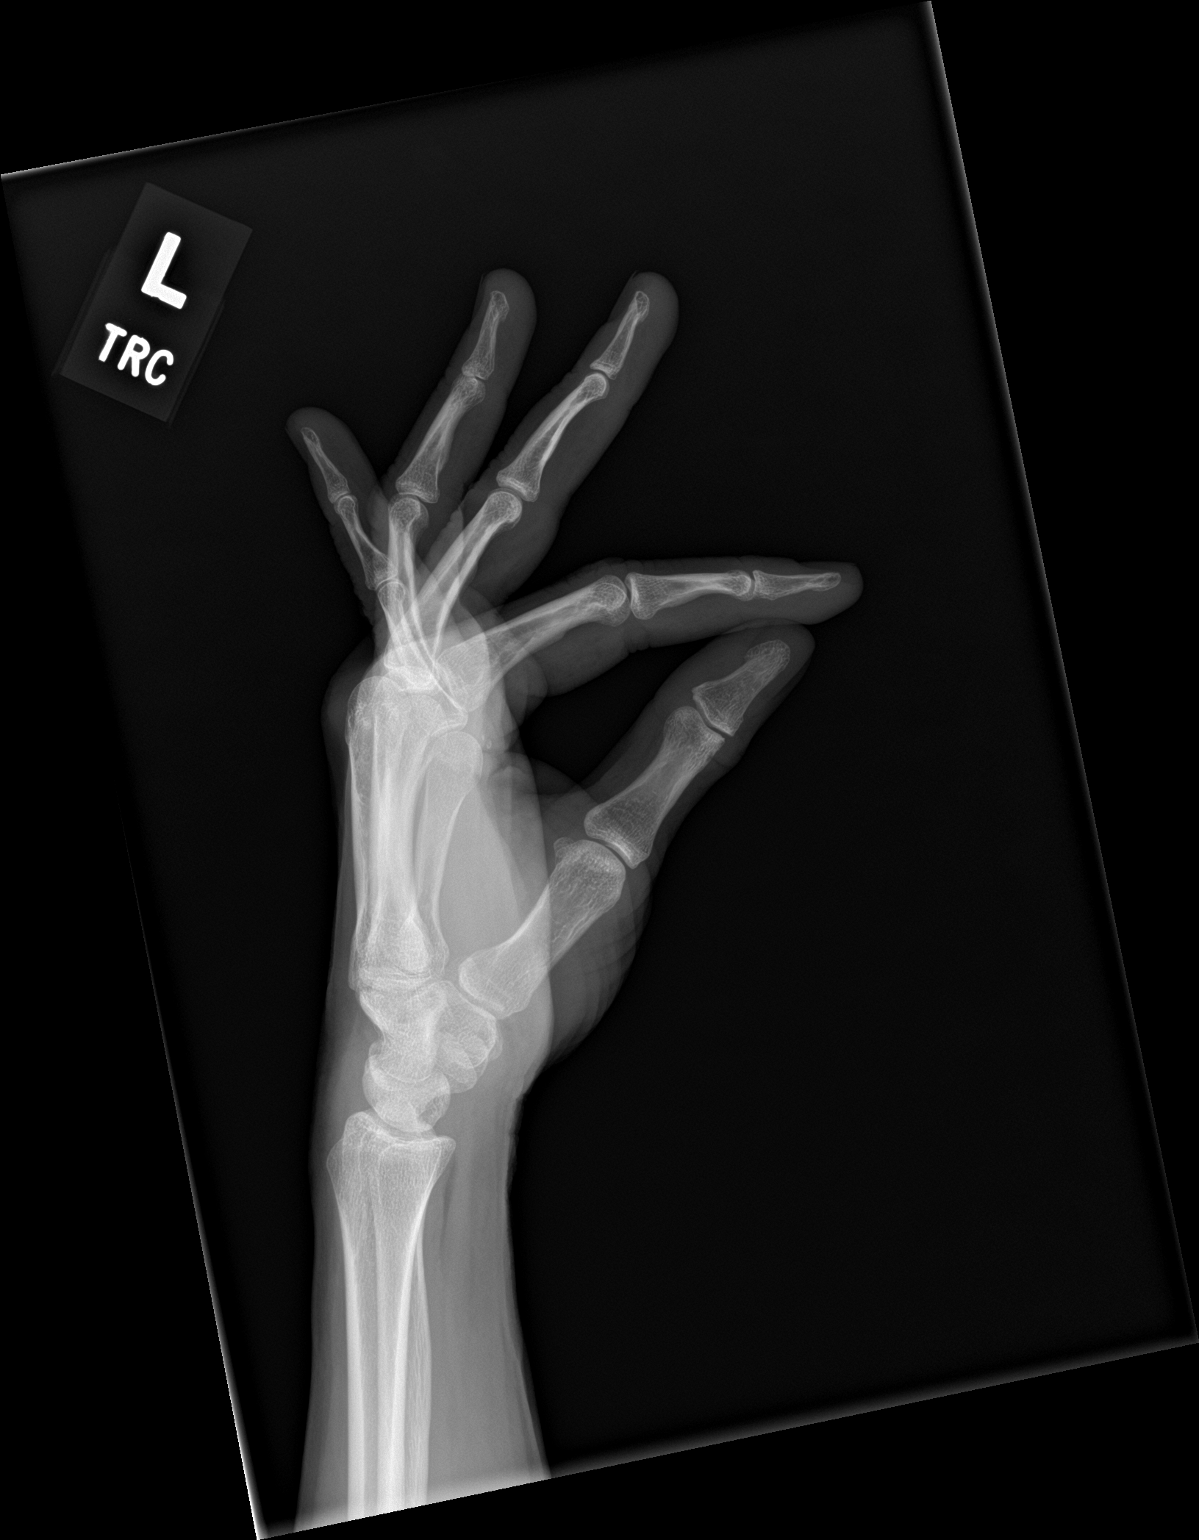

[3 of 3 positions shown; findings below may reference images not displayed]

FINDINGS: There is no evidence of fracture or dislocation. There is no
evidence of arthropathy or other focal bone abnormality. Soft
tissues are unremarkable.
IMPRESSION: Negative.

## 2022-10-13 IMAGING — DX DG HAND COMPLETE 3+V*R*
3 series · 3 of 3 positions shown · non-contrast
Comparison: None.

CLINICAL DATA: Hand pain.

EXAM:
RIGHT HAND - COMPLETE 3+ VIEW

[hand pa]
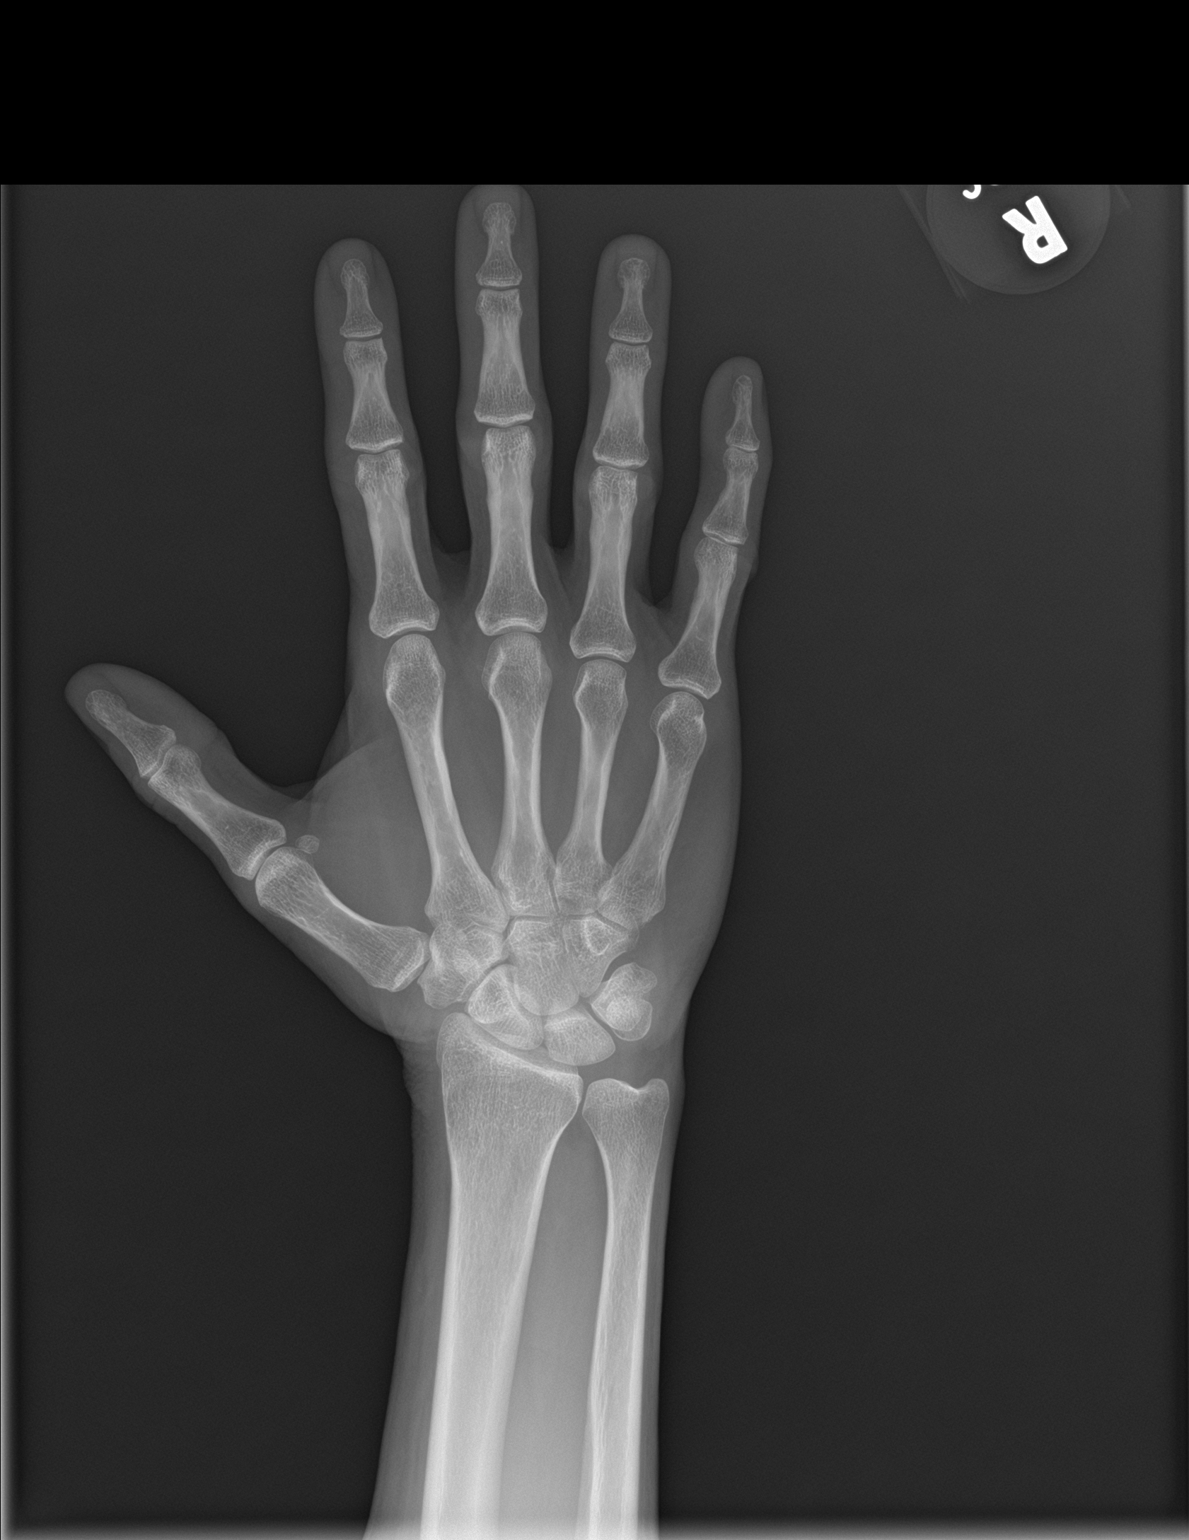

[hand obl]
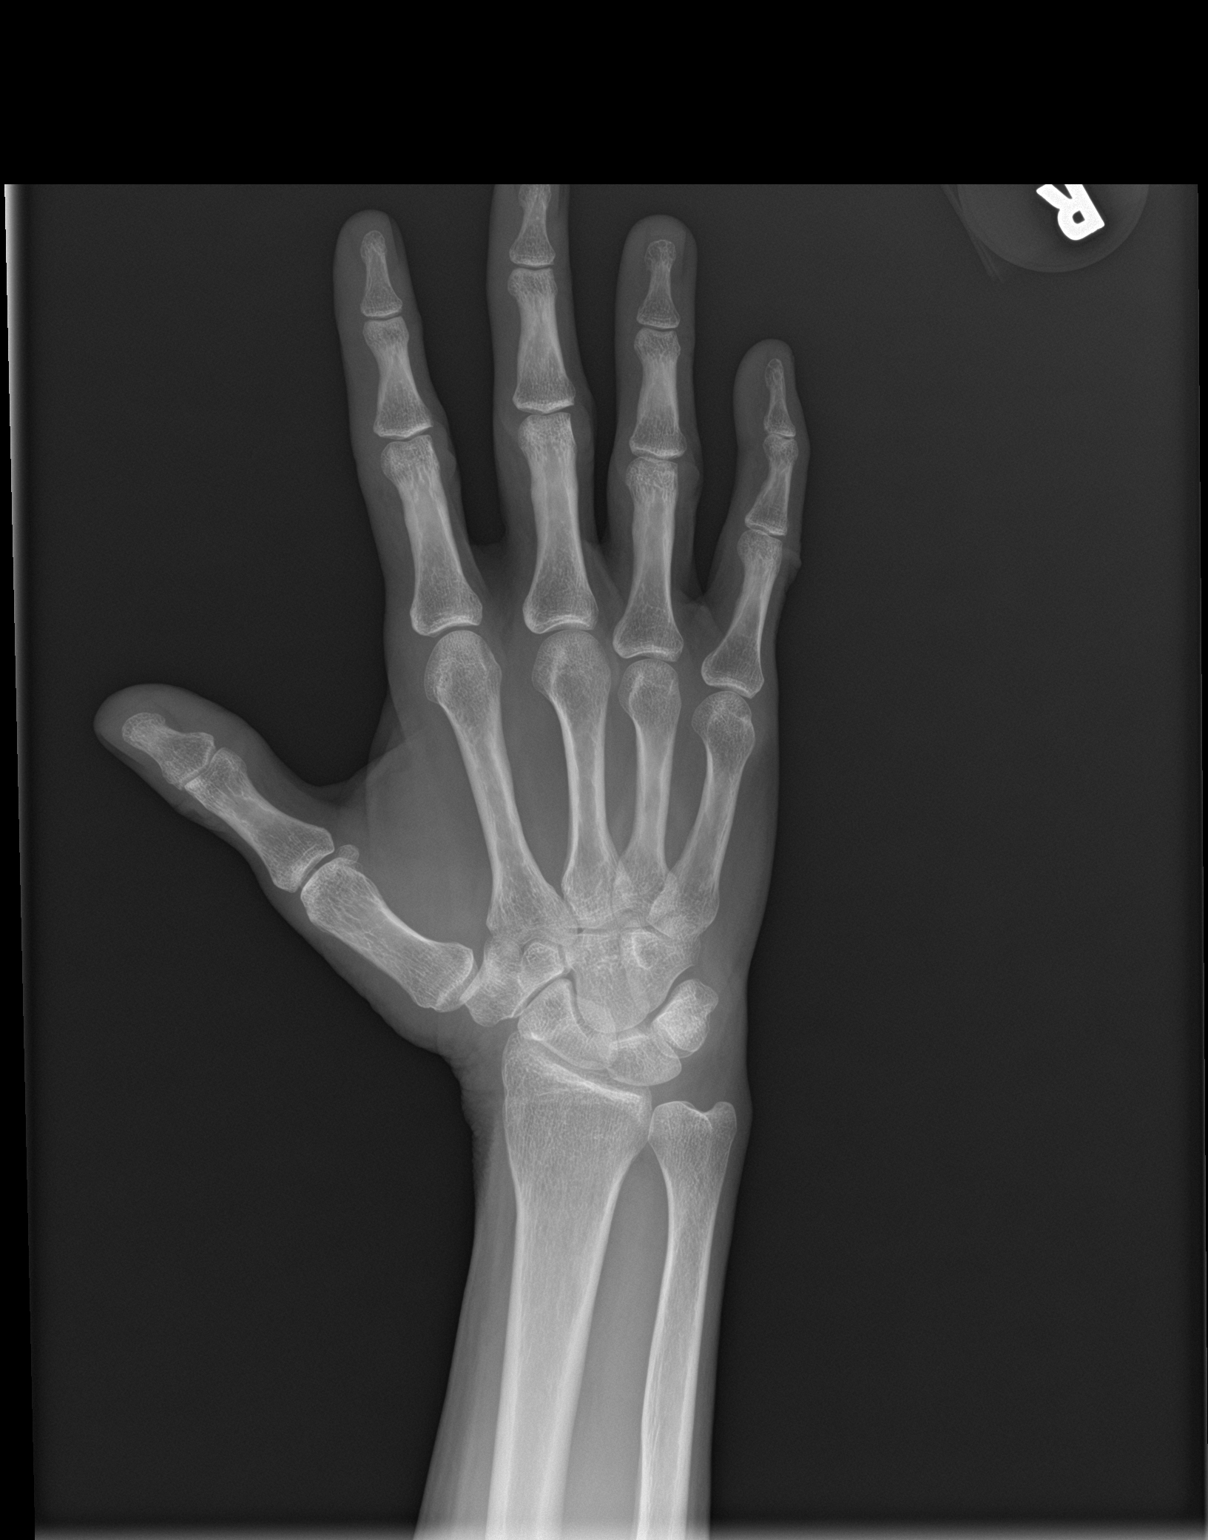

[hand lat]
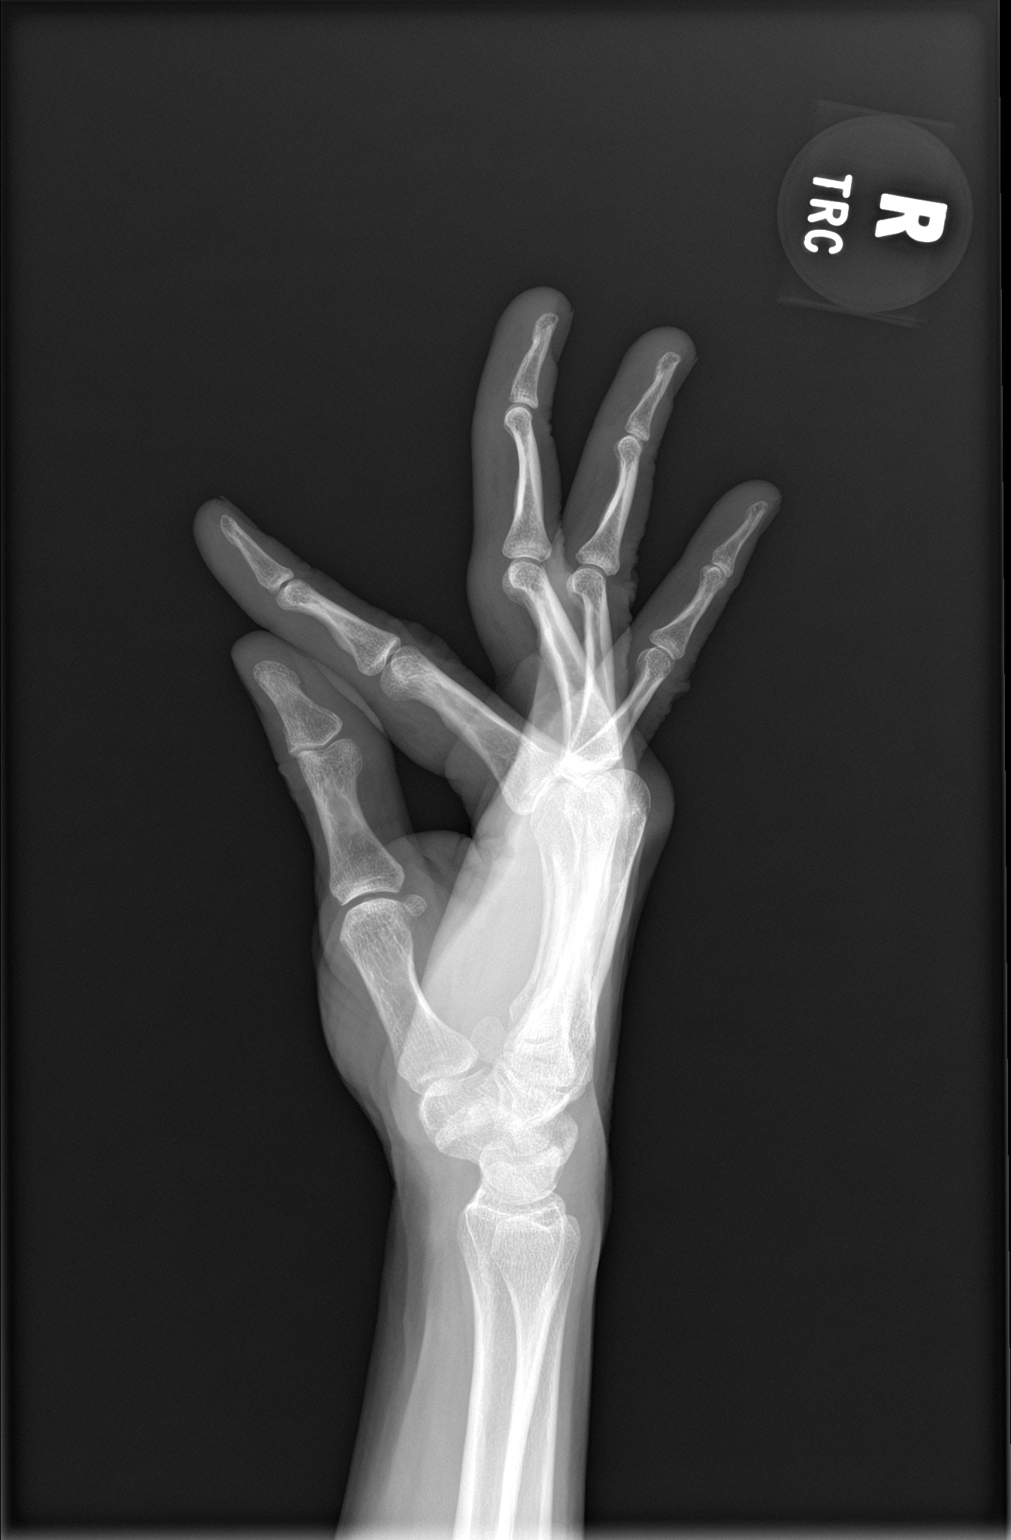

[3 of 3 positions shown; findings below may reference images not displayed]

FINDINGS: There is no evidence of fracture or dislocation. There is no
evidence of arthropathy or other focal bone abnormality. Soft
tissues are unremarkable.
IMPRESSION: Negative.

## 2022-11-02 DIAGNOSIS — Z01419 Encounter for gynecological examination (general) (routine) without abnormal findings: Secondary | ICD-10-CM | POA: Diagnosis not present

## 2022-11-08 ENCOUNTER — Ambulatory Visit (INDEPENDENT_AMBULATORY_CARE_PROVIDER_SITE_OTHER): Payer: BC Managed Care – PPO | Admitting: *Deleted

## 2022-11-08 DIAGNOSIS — M8000XA Age-related osteoporosis with current pathological fracture, unspecified site, initial encounter for fracture: Secondary | ICD-10-CM

## 2022-11-08 MED ORDER — ROMOSOZUMAB-AQQG 105 MG/1.17ML ~~LOC~~ SOSY
210.0000 mg | PREFILLED_SYRINGE | Freq: Once | SUBCUTANEOUS | Status: AC
Start: 2022-11-08 — End: 2022-11-08
  Administered 2022-11-08: 210 mg via SUBCUTANEOUS

## 2022-11-08 NOTE — Progress Notes (Signed)
Patient is here for her 9th Evenity injection. She received bilateral [place] arm injections. She will return in 1 month for her next Evenity.

## 2022-11-30 DIAGNOSIS — L570 Actinic keratosis: Secondary | ICD-10-CM | POA: Diagnosis not present

## 2022-11-30 DIAGNOSIS — L821 Other seborrheic keratosis: Secondary | ICD-10-CM | POA: Diagnosis not present

## 2022-11-30 DIAGNOSIS — L84 Corns and callosities: Secondary | ICD-10-CM | POA: Diagnosis not present

## 2022-11-30 DIAGNOSIS — D229 Melanocytic nevi, unspecified: Secondary | ICD-10-CM | POA: Diagnosis not present

## 2022-11-30 DIAGNOSIS — L820 Inflamed seborrheic keratosis: Secondary | ICD-10-CM | POA: Diagnosis not present

## 2022-11-30 DIAGNOSIS — L814 Other melanin hyperpigmentation: Secondary | ICD-10-CM | POA: Diagnosis not present

## 2022-12-13 ENCOUNTER — Ambulatory Visit: Payer: BC Managed Care – PPO

## 2022-12-14 ENCOUNTER — Ambulatory Visit (INDEPENDENT_AMBULATORY_CARE_PROVIDER_SITE_OTHER): Payer: BC Managed Care – PPO | Admitting: *Deleted

## 2022-12-14 DIAGNOSIS — M8000XA Age-related osteoporosis with current pathological fracture, unspecified site, initial encounter for fracture: Secondary | ICD-10-CM

## 2022-12-14 MED ORDER — ROMOSOZUMAB-AQQG 105 MG/1.17ML ~~LOC~~ SOSY
210.0000 mg | PREFILLED_SYRINGE | Freq: Once | SUBCUTANEOUS | Status: AC
Start: 2022-12-14 — End: 2022-12-14
  Administered 2022-12-14: 210 mg via SUBCUTANEOUS

## 2022-12-14 NOTE — Progress Notes (Signed)
Patient is here for her 10th Evenity injection. She received bilateral [place] arm injections. She will return in 1 month for her next Evenity.

## 2023-01-18 ENCOUNTER — Ambulatory Visit (INDEPENDENT_AMBULATORY_CARE_PROVIDER_SITE_OTHER): Payer: BC Managed Care – PPO | Admitting: Sports Medicine

## 2023-01-18 DIAGNOSIS — M810 Age-related osteoporosis without current pathological fracture: Secondary | ICD-10-CM | POA: Diagnosis not present

## 2023-01-18 MED ORDER — ROMOSOZUMAB-AQQG 105 MG/1.17ML ~~LOC~~ SOSY
210.0000 mg | PREFILLED_SYRINGE | Freq: Once | SUBCUTANEOUS | Status: AC
  Administered 2023-01-18: 210 mg via SUBCUTANEOUS

## 2023-01-18 NOTE — Progress Notes (Signed)
Patient is here for evenity injection #11. Patient received bilateral arm [place] evenity injections today. She tolerated injections well. She will return in 1 month for her next evenity injection.

## 2023-02-22 ENCOUNTER — Ambulatory Visit (INDEPENDENT_AMBULATORY_CARE_PROVIDER_SITE_OTHER): Payer: BC Managed Care – PPO | Admitting: Family Medicine

## 2023-02-22 DIAGNOSIS — M810 Age-related osteoporosis without current pathological fracture: Secondary | ICD-10-CM

## 2023-02-22 MED ORDER — ROMOSOZUMAB-AQQG 105 MG/1.17ML ~~LOC~~ SOSY
210.0000 mg | PREFILLED_SYRINGE | Freq: Once | SUBCUTANEOUS | Status: AC
  Administered 2023-02-22: 210 mg via SUBCUTANEOUS

## 2023-02-22 NOTE — Addendum Note (Signed)
Addended by: Annita Brod on: 02/22/2023 02:45 PM   Modules accepted: Orders

## 2023-02-22 NOTE — Patient Instructions (Addendum)
Call to schedule your bone density scan at: [facility] Address: [address], [place], [place] [address] Phone: [phone]

## 2023-02-22 NOTE — Progress Notes (Signed)
Patient is here for evenity injection #12. Patient received bilateral arm [place] evenity injections today. She tolerated injections well. She will return in about 1 month for the results of her bone density and to discuss next steps in therapy.

## 2023-03-15 DIAGNOSIS — D492 Neoplasm of unspecified behavior of bone, soft tissue, and skin: Secondary | ICD-10-CM | POA: Diagnosis not present

## 2023-03-15 DIAGNOSIS — G8918 Other acute postprocedural pain: Secondary | ICD-10-CM | POA: Diagnosis not present

## 2023-03-15 DIAGNOSIS — L578 Other skin changes due to chronic exposure to nonionizing radiation: Secondary | ICD-10-CM | POA: Diagnosis not present

## 2023-03-15 DIAGNOSIS — Z85828 Personal history of other malignant neoplasm of skin: Secondary | ICD-10-CM | POA: Diagnosis not present

## 2023-03-15 DIAGNOSIS — L84 Corns and callosities: Secondary | ICD-10-CM | POA: Diagnosis not present

## 2023-03-15 DIAGNOSIS — L570 Actinic keratosis: Secondary | ICD-10-CM | POA: Diagnosis not present

## 2023-04-07 DIAGNOSIS — S82872D Displaced pilon fracture of left tibia, subsequent encounter for closed fracture with routine healing: Secondary | ICD-10-CM | POA: Diagnosis not present

## 2023-04-07 DIAGNOSIS — S93492A Sprain of other ligament of left ankle, initial encounter: Secondary | ICD-10-CM | POA: Diagnosis not present

## 2023-04-07 DIAGNOSIS — S8262XD Displaced fracture of lateral malleolus of left fibula, subsequent encounter for closed fracture with routine healing: Secondary | ICD-10-CM | POA: Diagnosis not present

## 2023-04-07 DIAGNOSIS — Z4889 Encounter for other specified surgical aftercare: Secondary | ICD-10-CM | POA: Diagnosis not present

## 2023-04-12 DIAGNOSIS — K219 Gastro-esophageal reflux disease without esophagitis: Secondary | ICD-10-CM | POA: Diagnosis not present

## 2023-04-12 DIAGNOSIS — M8080XD Other osteoporosis with current pathological fracture, unspecified site, subsequent encounter for fracture with routine healing: Secondary | ICD-10-CM | POA: Diagnosis not present

## 2023-04-12 DIAGNOSIS — E349 Endocrine disorder, unspecified: Secondary | ICD-10-CM | POA: Diagnosis not present

## 2023-04-12 DIAGNOSIS — Z0000 Encounter for general adult medical examination without abnormal findings: Secondary | ICD-10-CM | POA: Diagnosis not present

## 2023-04-12 DIAGNOSIS — L989 Disorder of the skin and subcutaneous tissue, unspecified: Secondary | ICD-10-CM | POA: Diagnosis not present

## 2023-04-12 DIAGNOSIS — Z23 Encounter for immunization: Secondary | ICD-10-CM | POA: Diagnosis not present

## 2023-04-12 DIAGNOSIS — E7800 Pure hypercholesterolemia, unspecified: Secondary | ICD-10-CM | POA: Diagnosis not present

## 2023-04-12 LAB — LAB REPORT - SCANNED: EGFR: 66

## 2023-04-30 NOTE — Telephone Encounter (Signed)
Pt has completed treatment with Evenity.  Consider transition to RANK ligand (RANKL) inhibitor, [facility] Antibody

## 2023-05-02 ENCOUNTER — Ambulatory Visit (HOSPITAL_BASED_OUTPATIENT_CLINIC_OR_DEPARTMENT_OTHER)
Admission: RE | Admit: 2023-05-02 | Discharge: 2023-05-02 | Disposition: A | Discharge status: Home / Self Care | Payer: BC Managed Care – PPO | Source: Ambulatory Visit | Attending: Family Medicine | Admitting: Family Medicine

## 2023-05-02 DIAGNOSIS — M85851 Other specified disorders of bone density and structure, right thigh: Secondary | ICD-10-CM | POA: Diagnosis not present

## 2023-05-02 DIAGNOSIS — M810 Age-related osteoporosis without current pathological fracture: Secondary | ICD-10-CM | POA: Insufficient documentation

## 2023-05-02 DIAGNOSIS — M85832 Other specified disorders of bone density and structure, left forearm: Secondary | ICD-10-CM | POA: Diagnosis not present

## 2023-05-09 ENCOUNTER — Encounter: Payer: Self-pay | Admitting: *Deleted

## 2023-05-10 DIAGNOSIS — M62830 Muscle spasm of back: Secondary | ICD-10-CM | POA: Diagnosis not present

## 2023-05-16 DIAGNOSIS — G5622 Lesion of ulnar nerve, left upper limb: Secondary | ICD-10-CM | POA: Diagnosis not present

## 2023-06-01 ENCOUNTER — Ambulatory Visit (INDEPENDENT_AMBULATORY_CARE_PROVIDER_SITE_OTHER): Payer: BC Managed Care – PPO | Admitting: Family Medicine

## 2023-06-01 VITALS — BP 86/66 | Ht 61.0 in | Wt 110.0 lb

## 2023-06-01 DIAGNOSIS — M810 Age-related osteoporosis without current pathological fracture: Secondary | ICD-10-CM

## 2023-06-01 NOTE — Patient Instructions (Signed)
Take 1200mg  of calcium and 800 international units of vitamin D daily. Continue walking for exercise. We are getting labs from [facility] and will call you if additional labs are needed (calcium and vitamin D are the main 2 we need to check). We will look into prolia for you and call you with your portion/coverage for it.

## 2023-06-02 ENCOUNTER — Encounter: Payer: Self-pay | Admitting: *Deleted

## 2023-06-02 NOTE — Progress Notes (Signed)
PCP: Jarrett Soho, PA-C  Subjective:   HPI: Patient is a 52 y.o. female here for osteoporosis follow-up.  Patient completed her osteoporosis treatment with evenity x 12 doses (though had a several month break in between due to insurance denial of the medication) and unfortunately Dexa without improvement in her bone density.  Here to discuss further options.  Prior treatment: evenity, single dose of fosamax with severe reaction (rash, allergic) History of Hip, Spine, or Wrist Fracture: no - had tib/fib fracture with delayed union, noted to have 'soft bone' when surgeon operated Heart disease or stroke: no Cancer: skin, basal cell, melanoma Kidney Disease: no Gastric/Peptic Ulcer: no Gastric bypass surgery: no Severe GERD: somewhat History of seizures: no Age at Menopause: [age] Calcium intake: none Vitamin D intake: none Hormone replacement therapy: no Smoking history: never Alcohol: max once a month Exercise: only walking when running errands Major dental work in past year: no Parents with hip/spine fracture: unknown - adopted  Past Medical History:  Diagnosis Date   Cancer (HCC)    skin, basil, squamaous and melanoma, Dr. Ubaldo Glassing [place]   Complication of anesthesia    pt states woke up during procedure     Current Outpatient Medications on File Prior to Visit  Medication Sig Dispense Refill   camphor-menthol (SARNA) lotion Apply topically as needed for itching. 222 mL 0   citalopram (CELEXA) 20 MG tablet Take 20 mg by mouth every other day.     citalopram (CELEXA) 40 MG tablet Take 30 mg by mouth every other day.     diphenhydrAMINE (BENADRYL) 25 mg capsule Take 1 capsule (25 mg total) by mouth every 4 (four) hours as needed for itching or allergies. 30 capsule 0   fluticasone (FLONASE) 50 MCG/ACT nasal spray Place 1 spray into both nostrils daily as needed for allergies.     methocarbamol (ROBAXIN) 500 MG tablet Take 1 tablet (500 mg total) by mouth every 6  (six) hours as needed for muscle spasms. 30 tablet 0   Multiple Vitamin (MULTIVITAMIN WITH MINERALS) TABS tablet Take 1 tablet by mouth daily.     Multiple Vitamins-Minerals (HAIR SKIN & NAILS ADVANCED PO) Take 1 tablet by mouth daily.     scopolamine (TRANSDERM-SCOP) 1 MG/3DAYS Place 1 patch onto the skin every 3 (three) days.     terbinafine (LAMISIL) 250 MG tablet Take 1 tablet (250 mg total) by mouth daily. 45 tablet 0   terbinafine (LAMISIL) 250 MG tablet Please take one a day x 7days, repeat every 4 weeks x 4 months 28 tablet 0   No current facility-administered medications on file prior to visit.    Past Surgical History:  Procedure Laterality Date   BREAST SURGERY     nov 2014 breast implant DR Benna Dunks   COLONOSCOPY     x2, polyps resected   COLONOSCOPY WITH PROPOFOL N/A 03/24/2015   Procedure: COLONOSCOPY WITH PROPOFOL;  Surgeon: Charolett Bumpers, MD;  Location: [facility];  Service: Endoscopy;  Laterality: N/A;   OPEN REDUCTION INTERNAL FIXATION (ORIF) TIBIA/FIBULA FRACTURE Left 04/04/2021   Procedure: OPEN REDUCTION INTERNAL FIXATION (ORIF) TIBIA/FIBULA FRACTURE;  Surgeon: Netta Cedars, MD;  Location: [facility];  Service: Orthopedics;  Laterality: Left;   TONSILLECTOMY     WISDOM TOOTH EXTRACTION      Allergies  Allergen Reactions   Doxycycline Rash   Clindamycin Hcl Rash   Keflex [Cephalexin] Rash   Minocycline Rash   Sulfa Antibiotics Rash    Other reaction(s): Other (  See Comments), Other (See Comments)   Sulfacetamide Sodium Rash    BP (!) 86/66   Ht 5\' 1"  (1.549 m)   Wt 110 lb (49.9 kg)   LMP 07/31/2018   BMI 20.78 kg/m       No data to display              No data to display              Objective:  Physical Exam:  Gen: NAD, comfortable in exam room  Dexa 05/02/23 T scores: R fem neck -2.5, total femur -2.3, L radius -1.2 (down from -0.2) Labs 10/15: calcium 9.6, 25-OH Vit D 34.7   Assessment & Plan:  1. Osteoporosis - completed  evenity treatment without improvement - in fact distal radius worsened compared to DEXA 2 years ago.  Worst T score however is -2.5.  She has history of cancer so cannot do tymlos/forteo.  Bad allergic reaction to 1 dose of fosamax so avoid bisphosphonates.  Will proceed with Prolia.  Total visit time 25 minutes including documentation.

## 2023-06-06 ENCOUNTER — Telehealth: Payer: Self-pay | Admitting: *Deleted

## 2023-06-06 NOTE — Telephone Encounter (Addendum)
Prolia VOB initiated via [email or web address]  Patient ready for scheduling on or after: 06/15/23  Last OV: 06/01/23 Completed Evenity injections: 02/22/23  OOP due at time of visit: $60 copay, or $25 if using [employer] copay program.   Deductible: does not apply  PA: Approved Ref #: [id number] From: 06/08/23 to 06/07/24

## 2023-06-07 DIAGNOSIS — L820 Inflamed seborrheic keratosis: Secondary | ICD-10-CM | POA: Diagnosis not present

## 2023-06-07 DIAGNOSIS — D229 Melanocytic nevi, unspecified: Secondary | ICD-10-CM | POA: Diagnosis not present

## 2023-06-07 DIAGNOSIS — L821 Other seborrheic keratosis: Secondary | ICD-10-CM | POA: Diagnosis not present

## 2023-06-07 DIAGNOSIS — L578 Other skin changes due to chronic exposure to nonionizing radiation: Secondary | ICD-10-CM | POA: Diagnosis not present

## 2023-06-07 DIAGNOSIS — L814 Other melanin hyperpigmentation: Secondary | ICD-10-CM | POA: Diagnosis not present

## 2023-06-13 DIAGNOSIS — J029 Acute pharyngitis, unspecified: Secondary | ICD-10-CM | POA: Diagnosis not present

## 2023-06-13 DIAGNOSIS — K219 Gastro-esophageal reflux disease without esophagitis: Secondary | ICD-10-CM | POA: Diagnosis not present

## 2023-06-13 DIAGNOSIS — Z800 Family history of malignant neoplasm of digestive organs: Secondary | ICD-10-CM | POA: Diagnosis not present

## 2023-06-13 DIAGNOSIS — Z6822 Body mass index (BMI) 22.0-22.9, adult: Secondary | ICD-10-CM | POA: Diagnosis not present

## 2023-06-16 ENCOUNTER — Other Ambulatory Visit: Payer: Self-pay | Admitting: *Deleted

## 2023-06-16 DIAGNOSIS — M8000XA Age-related osteoporosis with current pathological fracture, unspecified site, initial encounter for fracture: Secondary | ICD-10-CM

## 2023-07-01 DIAGNOSIS — Z1231 Encounter for screening mammogram for malignant neoplasm of breast: Secondary | ICD-10-CM | POA: Diagnosis not present

## 2023-07-04 DIAGNOSIS — G5622 Lesion of ulnar nerve, left upper limb: Secondary | ICD-10-CM | POA: Diagnosis not present

## 2023-07-04 DIAGNOSIS — G5602 Carpal tunnel syndrome, left upper limb: Secondary | ICD-10-CM | POA: Diagnosis not present

## 2023-07-18 DIAGNOSIS — G5602 Carpal tunnel syndrome, left upper limb: Secondary | ICD-10-CM | POA: Diagnosis not present

## 2023-07-18 DIAGNOSIS — G5622 Lesion of ulnar nerve, left upper limb: Secondary | ICD-10-CM | POA: Diagnosis not present

## 2023-08-29 ENCOUNTER — Other Ambulatory Visit: Payer: BC Managed Care – PPO

## 2023-09-08 DIAGNOSIS — M79602 Pain in left arm: Secondary | ICD-10-CM | POA: Diagnosis not present

## 2023-10-06 DIAGNOSIS — M25512 Pain in left shoulder: Secondary | ICD-10-CM | POA: Diagnosis not present

## 2023-10-06 DIAGNOSIS — M47812 Spondylosis without myelopathy or radiculopathy, cervical region: Secondary | ICD-10-CM | POA: Diagnosis not present

## 2023-10-13 DIAGNOSIS — M79602 Pain in left arm: Secondary | ICD-10-CM | POA: Diagnosis not present

## 2023-10-13 DIAGNOSIS — M542 Cervicalgia: Secondary | ICD-10-CM | POA: Diagnosis not present

## 2023-10-20 DIAGNOSIS — M79602 Pain in left arm: Secondary | ICD-10-CM | POA: Diagnosis not present

## 2023-11-07 NOTE — Telephone Encounter (Addendum)
 Prolia VOB initiated via [email or web address]  Next Prolia inj DUE: now  Patient is ready for scheduling on or after  BUY AND BILL  Out-of-pocket cost due at time of visit: $333.71 (or $25 if using [employer] copay program). She is enrolled for Prolia and is eligible for the full $1500 amount.   Primary: [employer] [place] Prolia co-insurance: 20% (approximately $333.71, up to $6250 OOP max ($1,306.81 met) Admin fee co-insurance: covered at 100%  Deductible: $2000 (586.81 met) does contribute to OOP max.  Prior Auth: Approved  Ref #: [id number]  From: 06/08/23 to 06/07/24    ** This summary of benefits is an estimation of the patient's out-of-pocket cost. Exact cost may vary based on individual plan coverage.

## 2023-11-09 DIAGNOSIS — M542 Cervicalgia: Secondary | ICD-10-CM | POA: Diagnosis not present

## 2023-11-09 DIAGNOSIS — M79602 Pain in left arm: Secondary | ICD-10-CM | POA: Diagnosis not present

## 2023-11-15 DIAGNOSIS — Z01419 Encounter for gynecological examination (general) (routine) without abnormal findings: Secondary | ICD-10-CM | POA: Diagnosis not present

## 2023-11-15 DIAGNOSIS — Z780 Asymptomatic menopausal state: Secondary | ICD-10-CM | POA: Diagnosis not present

## 2023-11-17 DIAGNOSIS — M542 Cervicalgia: Secondary | ICD-10-CM | POA: Diagnosis not present

## 2023-11-17 DIAGNOSIS — M79602 Pain in left arm: Secondary | ICD-10-CM | POA: Diagnosis not present

## 2023-11-23 ENCOUNTER — Telehealth: Payer: Self-pay | Admitting: *Deleted

## 2023-11-23 NOTE — Telephone Encounter (Signed)
 Talked to pt on 11/15/23 about begin prolia for osteoporosis continued therapy. Provided pt with the benefits breakdown and she was going to call amgen [phone]) to research the amgen copay card amount.  Her primary concern was to see if the $1500 amgen awards is for a singular injection or 2 per year.  Pt will call back and let us  know when she wants to go forward with prolia.

## 2023-11-24 DIAGNOSIS — M79602 Pain in left arm: Secondary | ICD-10-CM | POA: Diagnosis not present

## 2023-11-24 DIAGNOSIS — M542 Cervicalgia: Secondary | ICD-10-CM | POA: Diagnosis not present

## 2023-12-08 DIAGNOSIS — M79602 Pain in left arm: Secondary | ICD-10-CM | POA: Diagnosis not present

## 2023-12-08 DIAGNOSIS — M542 Cervicalgia: Secondary | ICD-10-CM | POA: Diagnosis not present

## 2023-12-13 DIAGNOSIS — L814 Other melanin hyperpigmentation: Secondary | ICD-10-CM | POA: Diagnosis not present

## 2023-12-13 DIAGNOSIS — L578 Other skin changes due to chronic exposure to nonionizing radiation: Secondary | ICD-10-CM | POA: Diagnosis not present

## 2023-12-13 DIAGNOSIS — D229 Melanocytic nevi, unspecified: Secondary | ICD-10-CM | POA: Diagnosis not present

## 2023-12-13 DIAGNOSIS — L821 Other seborrheic keratosis: Secondary | ICD-10-CM | POA: Diagnosis not present

## 2023-12-13 DIAGNOSIS — L820 Inflamed seborrheic keratosis: Secondary | ICD-10-CM | POA: Diagnosis not present

## 2023-12-22 DIAGNOSIS — M79602 Pain in left arm: Secondary | ICD-10-CM | POA: Diagnosis not present

## 2023-12-29 DIAGNOSIS — M79602 Pain in left arm: Secondary | ICD-10-CM | POA: Diagnosis not present

## 2023-12-29 DIAGNOSIS — M542 Cervicalgia: Secondary | ICD-10-CM | POA: Diagnosis not present

## 2024-01-04 DIAGNOSIS — M542 Cervicalgia: Secondary | ICD-10-CM | POA: Diagnosis not present

## 2024-01-04 DIAGNOSIS — M79602 Pain in left arm: Secondary | ICD-10-CM | POA: Diagnosis not present

## 2024-01-19 DIAGNOSIS — M79602 Pain in left arm: Secondary | ICD-10-CM | POA: Diagnosis not present

## 2024-01-26 DIAGNOSIS — M47812 Spondylosis without myelopathy or radiculopathy, cervical region: Secondary | ICD-10-CM | POA: Diagnosis not present

## 2024-01-26 DIAGNOSIS — M79602 Pain in left arm: Secondary | ICD-10-CM | POA: Diagnosis not present

## 2024-01-26 DIAGNOSIS — M25512 Pain in left shoulder: Secondary | ICD-10-CM | POA: Diagnosis not present

## 2024-03-05 DIAGNOSIS — M25512 Pain in left shoulder: Secondary | ICD-10-CM | POA: Diagnosis not present

## 2024-03-05 DIAGNOSIS — M542 Cervicalgia: Secondary | ICD-10-CM | POA: Diagnosis not present

## 2024-03-20 DIAGNOSIS — M47812 Spondylosis without myelopathy or radiculopathy, cervical region: Secondary | ICD-10-CM | POA: Diagnosis not present

## 2024-03-20 DIAGNOSIS — M5020 Other cervical disc displacement, unspecified cervical region: Secondary | ICD-10-CM | POA: Diagnosis not present

## 2024-04-23 ENCOUNTER — Other Ambulatory Visit: Payer: Self-pay

## 2024-04-23 ENCOUNTER — Ambulatory Visit: Admitting: Family Medicine

## 2024-04-23 ENCOUNTER — Encounter (HOSPITAL_COMMUNITY): Payer: Self-pay

## 2024-04-23 ENCOUNTER — Telehealth: Payer: Self-pay

## 2024-04-23 ENCOUNTER — Other Ambulatory Visit (HOSPITAL_COMMUNITY): Payer: Self-pay

## 2024-04-23 VITALS — BP 100/68 | Ht 61.0 in | Wt 115.0 lb

## 2024-04-23 DIAGNOSIS — M810 Age-related osteoporosis without current pathological fracture: Secondary | ICD-10-CM

## 2024-04-23 MED ORDER — JUBBONTI 60 MG/ML ~~LOC~~ SOSY
60.0000 mg | PREFILLED_SYRINGE | SUBCUTANEOUS | 0 refills | Status: AC
  Filled 2024-04-23: qty 1, 180d supply, fill #0

## 2024-04-23 NOTE — Progress Notes (Signed)
 PCP: Katina Pfeiffer, PA-C  Patient is a 53 y.o. female here for osteoporosis.  HPI Patient returns to discuss starting osteoporosis medications. Unfortunately evenity  did not provide improvement in her bone density. She returns today to discuss starting prolia. Severe allergic rash with fosamax. History of melanoma, squamous cell carcinoma so avoiding tymlos/forteo.  Past Medical History:  Diagnosis Date   Cancer (HCC)    skin, basil, squamaous and melanoma, Dr. Debby HOUSTON [place]   Complication of anesthesia    pt states woke up during procedure     Current Outpatient Medications on File Prior to Visit  Medication Sig Dispense Refill   camphor-menthol  (SARNA) lotion Apply topically as needed for itching. 222 mL 0   citalopram  (CELEXA ) 20 MG tablet Take 20 mg by mouth every other day.     citalopram  (CELEXA ) 40 MG tablet Take 30 mg by mouth every other day.     diphenhydrAMINE  (BENADRYL ) 25 mg capsule Take 1 capsule (25 mg total) by mouth every 4 (four) hours as needed for itching or allergies. 30 capsule 0   fluticasone  (FLONASE ) 50 MCG/ACT nasal spray Place 1 spray into both nostrils daily as needed for allergies.     methocarbamol  (ROBAXIN ) 500 MG tablet Take 1 tablet (500 mg total) by mouth every 6 (six) hours as needed for muscle spasms. 30 tablet 0   Multiple Vitamin (MULTIVITAMIN WITH MINERALS) TABS tablet Take 1 tablet by mouth daily.     Multiple Vitamins-Minerals (HAIR SKIN & NAILS ADVANCED PO) Take 1 tablet by mouth daily.     scopolamine (TRANSDERM-SCOP) 1 MG/3DAYS Place 1 patch onto the skin every 3 (three) days.     terbinafine  (LAMISIL ) 250 MG tablet Take 1 tablet (250 mg total) by mouth daily. 45 tablet 0   terbinafine  (LAMISIL ) 250 MG tablet Please take one a day x 7days, repeat every 4 weeks x 4 months 28 tablet 0   No current facility-administered medications on file prior to visit.    Past Surgical History:  Procedure Laterality Date   BREAST SURGERY      nov 2014 breast implant DR Verneda   COLONOSCOPY     x2, polyps resected   COLONOSCOPY WITH PROPOFOL  N/A 03/24/2015   Procedure: COLONOSCOPY WITH PROPOFOL ;  Surgeon: Gladis MARLA Louder, MD;  Location: [facility];  Service: Endoscopy;  Laterality: N/A;   OPEN REDUCTION INTERNAL FIXATION (ORIF) TIBIA/FIBULA FRACTURE Left 04/04/2021   Procedure: OPEN REDUCTION INTERNAL FIXATION (ORIF) TIBIA/FIBULA FRACTURE;  Surgeon: Barton Drape, MD;  Location: [facility];  Service: Orthopedics;  Laterality: Left;   TONSILLECTOMY     WISDOM TOOTH EXTRACTION      Allergies  Allergen Reactions   Doxycycline Rash   Clindamycin  Hcl Rash   Keflex [Cephalexin] Rash   Minocycline Rash   Sulfa Antibiotics Rash    Other reaction(s): Other (See Comments), Other (See Comments)   Sulfacetamide Sodium Rash    BP 100/68   Ht 5' 1 (1.549 m)   Wt 115 lb (52.2 kg)   LMP 07/31/2018   BMI 21.73 kg/m       No data to display              No data to display              Objective:  Physical Exam:  Gen: NAD, comfortable in exam room  Dexa 05/02/23 T scores: R fem neck -2.5, total femur -2.3, L radius -1.2  Assessment and Plan:  Osteoporosis - history  of tib/fib fracture with delayed union.  Unfortunately no improvement with evenity  treatment.  Severe allergic rash to fosamax.  History of melanoma and squamous cell carcinoma so will avoid tymlos/forteo.  Looking into prolia/jubbonti.  Get BMP and vitamin D  levels.  Total visit time 10 minutes including documentation.

## 2024-04-23 NOTE — Progress Notes (Signed)
 Pharmacy Patient Advocate Encounter  Insurance verification completed.   The patient is insured through [facility]   Ran test claim for Jubbonti. Not covered through pharmacy benefit. May be covered through medical benefit. Will inform office.  This test claim was processed through [facility]- copay amounts may vary at other pharmacies due to pharmacy/plan contracts, or as the patient moves through the different stages of their insurance plan.

## 2024-04-23 NOTE — Progress Notes (Signed)
 Patient to be enrolled with [facility]. Routed to Tiffany.

## 2024-04-23 NOTE — Telephone Encounter (Signed)
 Jubbonti is not covered through pharmacy benefit but may be covered through medical benefits.

## 2024-04-24 ENCOUNTER — Ambulatory Visit: Payer: Self-pay | Admitting: Family Medicine

## 2024-04-24 ENCOUNTER — Other Ambulatory Visit (HOSPITAL_COMMUNITY): Payer: Self-pay

## 2024-04-24 LAB — BASIC METABOLIC PANEL WITH GFR
BUN/Creatinine Ratio: 16 (ref 9–23)
BUN: 16 mg/dL (ref 6–24)
CO2: 21 mmol/L (ref 20–29)
Calcium: 9.5 mg/dL (ref 8.7–10.2)
Chloride: 106 mmol/L (ref 96–106)
Creatinine, Ser: 1.02 mg/dL — ABNORMAL HIGH (ref 0.57–1.00)
Glucose: 94 mg/dL (ref 70–99)
Potassium: 4.2 mmol/L (ref 3.5–5.2)
Sodium: 141 mmol/L (ref 134–144)
eGFR: 65 mL/min/1.73 (ref >59)

## 2024-04-24 LAB — VITAMIN D 25 HYDROXY (VIT D DEFICIENCY, FRACTURES): Vit D, 25-Hydroxy: 41.4 ng/mL (ref 30.0–100.0)

## 2024-04-24 NOTE — Telephone Encounter (Addendum)
 Medical Buy and Zell  Patient is ready for scheduling on or after: 06/08/24   Out-of-pocket cost due at time of visit: $120 (unless using copay assist, then $0 copay) SEE BELOW  Primary: BCBS  Prolia co-insurance: $60 Admin fee co-insurance: $60  Deductible: does not apply  Prior Auth: Approved Ref #: [id number] Valid: 06/08/24 - 06/08/25  ** This summary of benefits is an estimation of the patient's out-of-pocket cost. Exact cost may vary based on individual plan coverage.

## 2024-04-26 ENCOUNTER — Other Ambulatory Visit: Payer: Self-pay | Admitting: *Deleted

## 2024-04-26 ENCOUNTER — Other Ambulatory Visit: Payer: Self-pay

## 2024-04-26 MED ORDER — DENOSUMAB 60 MG/ML ~~LOC~~ SOSY
60.0000 mg | PREFILLED_SYRINGE | Freq: Once | SUBCUTANEOUS | 0 refills | Status: AC
  Filled 2024-04-26: qty 1, 1d supply, fill #0

## 2024-04-26 NOTE — Telephone Encounter (Signed)
 Prolia states is may be covered under medical benefit as well.

## 2024-04-30 NOTE — Progress Notes (Signed)
 Office will do buy and bill. Dis-enrolling.

## 2024-05-01 ENCOUNTER — Telehealth: Payer: Self-pay | Admitting: *Deleted

## 2024-05-01 DIAGNOSIS — K219 Gastro-esophageal reflux disease without esophagitis: Secondary | ICD-10-CM | POA: Diagnosis not present

## 2024-05-01 DIAGNOSIS — Z0000 Encounter for general adult medical examination without abnormal findings: Secondary | ICD-10-CM | POA: Diagnosis not present

## 2024-05-01 DIAGNOSIS — Z23 Encounter for immunization: Secondary | ICD-10-CM | POA: Diagnosis not present

## 2024-05-01 DIAGNOSIS — G2581 Restless legs syndrome: Secondary | ICD-10-CM | POA: Diagnosis not present

## 2024-05-01 DIAGNOSIS — E7800 Pure hypercholesterolemia, unspecified: Secondary | ICD-10-CM | POA: Diagnosis not present

## 2024-05-01 DIAGNOSIS — F419 Anxiety disorder, unspecified: Secondary | ICD-10-CM | POA: Diagnosis not present

## 2024-05-01 NOTE — Telephone Encounter (Signed)
 Discussed prolia option of using the $25 copay card that [employer] offers. Pt is going to get her insurance card and all legal paper done to change back to her maiden name.  She is going to contact me once that is all completed so I can have Harlene changed the authorizations dates IF the her name change on the insurance occurs before 06/07/24.  Pt is going to update info on the amgen portal as well.

## 2024-07-10 NOTE — Telephone Encounter (Addendum)
 2026 Prolia  benefits verified.  Medical Buy and Zell  Patient is ready for scheduling on or after: 07/12/24  Out-of-pocket cost due at time of visit: $60 copay or $0 if using [employer] copay assist.   Primary: [employer] [place] Prolia  co-insurance: n/a Administration is covered at 100%.  Deductible: $2000 ($8,364.80 met)  Prior Auth: Approved Ref #: [id number] (updated and verified with plan on 07/24/24) Auth #: [id number] Valid: 06/08/24 - 06/08/25 PA ph #: [phone]    ** This summary of benefits is an estimation of the patient's out-of-pocket cost. Exact cost may vary based on individual plan coverage.      Patient updated and enrolled in [employer] Copay assist program.

## 2024-07-12 ENCOUNTER — Other Ambulatory Visit: Payer: Self-pay | Admitting: *Deleted

## 2024-07-12 DIAGNOSIS — M810 Age-related osteoporosis without current pathological fracture: Secondary | ICD-10-CM

## 2024-08-02 ENCOUNTER — Other Ambulatory Visit: Payer: Self-pay | Admitting: *Deleted

## 2024-08-02 DIAGNOSIS — M810 Age-related osteoporosis without current pathological fracture: Secondary | ICD-10-CM
